# Patient Record
Sex: Female | Born: 1980 | Race: White | Hispanic: No | Marital: Married | Smoking: Never smoker
Health system: Southern US, Community
[De-identification: ages and names within clinical notes are randomized; demographics above are authoritative.]

## PROBLEM LIST (undated history)

## (undated) ENCOUNTER — Inpatient Hospital Stay (HOSPITAL_COMMUNITY): Payer: Self-pay

## (undated) DIAGNOSIS — O24419 Gestational diabetes mellitus in pregnancy, unspecified control: Secondary | ICD-10-CM

## (undated) DIAGNOSIS — O139 Gestational [pregnancy-induced] hypertension without significant proteinuria, unspecified trimester: Secondary | ICD-10-CM

## (undated) DIAGNOSIS — G510 Bell's palsy: Secondary | ICD-10-CM

## (undated) DIAGNOSIS — O1490 Unspecified pre-eclampsia, unspecified trimester: Secondary | ICD-10-CM

## (undated) HISTORY — DX: Bell's palsy: G51.0

## (undated) HISTORY — DX: Gestational diabetes mellitus in pregnancy, unspecified control: O24.419

## (undated) HISTORY — PX: OTHER SURGICAL HISTORY: SHX169

## (undated) HISTORY — PX: NO PAST SURGERIES: SHX2092

---

## 2003-11-13 ENCOUNTER — Inpatient Hospital Stay (HOSPITAL_COMMUNITY): Admission: AD | Admit: 2003-11-13 | Discharge: 2003-11-13 | Payer: Self-pay | Admitting: Obstetrics and Gynecology

## 2004-01-25 ENCOUNTER — Inpatient Hospital Stay (HOSPITAL_COMMUNITY): Admission: AD | Admit: 2004-01-25 | Discharge: 2004-01-25 | Payer: Self-pay | Admitting: Obstetrics & Gynecology

## 2004-02-12 ENCOUNTER — Inpatient Hospital Stay (HOSPITAL_COMMUNITY): Admission: AD | Admit: 2004-02-12 | Discharge: 2004-02-12 | Payer: Self-pay | Admitting: Obstetrics and Gynecology

## 2004-02-13 ENCOUNTER — Inpatient Hospital Stay (HOSPITAL_COMMUNITY): Admission: AD | Admit: 2004-02-13 | Discharge: 2004-02-14 | Payer: Self-pay | Admitting: Obstetrics and Gynecology

## 2004-02-15 ENCOUNTER — Inpatient Hospital Stay (HOSPITAL_COMMUNITY): Admission: AD | Admit: 2004-02-15 | Discharge: 2004-02-15 | Payer: Self-pay | Admitting: Obstetrics and Gynecology

## 2004-02-18 ENCOUNTER — Inpatient Hospital Stay (HOSPITAL_COMMUNITY): Admission: AD | Admit: 2004-02-18 | Discharge: 2004-02-18 | Payer: Self-pay | Admitting: Obstetrics and Gynecology

## 2004-02-19 ENCOUNTER — Inpatient Hospital Stay (HOSPITAL_COMMUNITY): Admission: AD | Admit: 2004-02-19 | Discharge: 2004-02-22 | Payer: Self-pay | Admitting: Obstetrics & Gynecology

## 2004-02-20 ENCOUNTER — Encounter (INDEPENDENT_AMBULATORY_CARE_PROVIDER_SITE_OTHER): Payer: Self-pay | Admitting: *Deleted

## 2004-04-09 ENCOUNTER — Other Ambulatory Visit: Admission: RE | Admit: 2004-04-09 | Discharge: 2004-04-09 | Payer: Self-pay | Admitting: Obstetrics and Gynecology

## 2005-10-21 ENCOUNTER — Other Ambulatory Visit: Admission: RE | Admit: 2005-10-21 | Discharge: 2005-10-21 | Payer: Self-pay | Admitting: Obstetrics and Gynecology

## 2007-11-16 ENCOUNTER — Inpatient Hospital Stay (HOSPITAL_COMMUNITY): Admission: AD | Admit: 2007-11-16 | Discharge: 2007-11-16 | Payer: Self-pay | Admitting: Obstetrics and Gynecology

## 2010-12-31 LAB — CBC
MCHC: 34.5
RBC: 4
RDW: 13.4
WBC: 7.8

## 2010-12-31 LAB — HCG, QUANTITATIVE, PREGNANCY: hCG, Beta Chain, Quant, S: 4

## 2010-12-31 LAB — ABO/RH: ABO/RH(D): B POS

## 2013-01-23 ENCOUNTER — Encounter: Payer: Self-pay | Admitting: Advanced Practice Midwife

## 2013-01-23 ENCOUNTER — Ambulatory Visit (INDEPENDENT_AMBULATORY_CARE_PROVIDER_SITE_OTHER): Payer: 59 | Admitting: Advanced Practice Midwife

## 2013-01-23 VITALS — BP 124/76 | Ht 62.0 in | Wt 217.0 lb

## 2013-01-23 DIAGNOSIS — Z8279 Family history of other congenital malformations, deformations and chromosomal abnormalities: Secondary | ICD-10-CM

## 2013-01-23 DIAGNOSIS — Z3480 Encounter for supervision of other normal pregnancy, unspecified trimester: Secondary | ICD-10-CM

## 2013-01-23 DIAGNOSIS — O09299 Supervision of pregnancy with other poor reproductive or obstetric history, unspecified trimester: Secondary | ICD-10-CM | POA: Insufficient documentation

## 2013-01-23 DIAGNOSIS — O09291 Supervision of pregnancy with other poor reproductive or obstetric history, first trimester: Secondary | ICD-10-CM

## 2013-01-23 DIAGNOSIS — O09211 Supervision of pregnancy with history of pre-term labor, first trimester: Secondary | ICD-10-CM | POA: Insufficient documentation

## 2013-01-23 DIAGNOSIS — E669 Obesity, unspecified: Secondary | ICD-10-CM

## 2013-01-23 DIAGNOSIS — O09219 Supervision of pregnancy with history of pre-term labor, unspecified trimester: Secondary | ICD-10-CM

## 2013-01-23 LAB — OB RESULTS CONSOLE GC/CHLAMYDIA
Chlamydia: NEGATIVE
Gonorrhea: NEGATIVE

## 2013-01-23 NOTE — Patient Instructions (Signed)
Pregnancy - First Trimester  During sexual intercourse, millions of sperm go into the vagina. Only 1 sperm will penetrate and fertilize the female egg while it is in the Fallopian tube. One week later, the fertilized egg implants into the wall of the uterus. An embryo begins to develop into a baby. At 6 to 8 weeks, the eyes and face are formed and the heartbeat can be seen on ultrasound. At the end of 12 weeks (first trimester), all the baby's organs are formed. Now that you are pregnant, you will want to do everything you can to have a healthy baby. Two of the most important things are to get good prenatal care and follow your caregiver's instructions. Prenatal care is all the medical care you receive before the baby's birth. It is given to prevent, find, and treat problems during the pregnancy and childbirth.  PRENATAL EXAMS  · During prenatal visits, your weight, blood pressure, and urine are checked. This is done to make sure you are healthy and progressing normally during the pregnancy.  · A pregnant woman should gain 25 to 35 pounds during the pregnancy. However, if you are overweight or underweight, your caregiver will advise you regarding your weight.  · Your caregiver will ask and answer questions for you.  · Blood work, cervical cultures, other necessary tests, and a Pap test are done during your prenatal exams. These tests are done to check on your health and the probable health of your baby. Tests are strongly recommended and done for HIV with your permission. This is the virus that causes AIDS. These tests are done because medicines can be given to help prevent your baby from being born with this infection should you have been infected without knowing it. Blood work is also used to find out your blood type, previous infections, and follow your blood levels (hemoglobin).  · Low hemoglobin (anemia) is common during pregnancy. Iron and vitamins are given to help prevent this. Later in the pregnancy, blood  tests for diabetes will be done along with any other tests if any problems develop.  · You may need other tests to make sure you and the baby are doing well.  CHANGES DURING THE FIRST TRIMESTER   Your body goes through many changes during pregnancy. They vary from person to person. Talk to your caregiver about changes you notice and are concerned about. Changes can include:  · Your menstrual period stops.  · The egg and sperm carry the genes that determine what you look like. Genes from you and your partner are forming a baby. The female genes determine whether the baby is a boy or a girl.  · Your body increases in girth and you may feel bloated.  · Feeling sick to your stomach (nauseous) and throwing up (vomiting). If the vomiting is uncontrollable, call your caregiver.  · Your breasts will begin to enlarge and become tender.  · Your nipples may stick out more and become darker.  · The need to urinate more. Painful urination may mean you have a bladder infection.  · Tiring easily.  · Loss of appetite.  · Cravings for certain kinds of food.  · At first, you may gain or lose a couple of pounds.  · You may have changes in your emotions from day to day (excited to be pregnant or concerned something may go wrong with the pregnancy and baby).  · You may have more vivid and strange dreams.  HOME CARE INSTRUCTIONS   ·   It is very important to avoid all smoking, alcohol and non-prescribed drugs during your pregnancy. These affect the formation and growth of the baby. Avoid chemicals while pregnant to ensure the delivery of a healthy infant.  · Start your prenatal visits by the 12th week of pregnancy. They are usually scheduled monthly at first, then more often in the last 2 months before delivery. Keep your caregiver's appointments. Follow your caregiver's instructions regarding medicine use, blood and lab tests, exercise, and diet.  · During pregnancy, you are providing food for you and your baby. Eat regular, well-balanced  meals. Choose foods such as meat, fish, milk and other low fat dairy products, vegetables, fruits, and whole-grain breads and cereals. Your caregiver will tell you of the ideal weight gain.  · You can help morning sickness by keeping soda crackers at the bedside. Eat a couple before arising in the morning. You may want to use the crackers without salt on them.  · Eating 4 to 5 small meals rather than 3 large meals a day also may help the nausea and vomiting.  · Drinking liquids between meals instead of during meals also seems to help nausea and vomiting.  · A physical sexual relationship may be continued throughout pregnancy if there are no other problems. Problems may be early (premature) leaking of amniotic fluid from the membranes, vaginal bleeding, or belly (abdominal) pain.  · Exercise regularly if there are no restrictions. Check with your caregiver or physical therapist if you are unsure of the safety of some of your exercises. Greater weight gain will occur in the last 2 trimesters of pregnancy. Exercising will help:  · Control your weight.  · Keep you in shape.  · Prepare you for labor and delivery.  · Help you lose your pregnancy weight after you deliver your baby.  · Wear a good support or jogging bra for breast tenderness during pregnancy. This may help if worn during sleep too.  · Ask when prenatal classes are available. Begin classes when they are offered.  · Do not use hot tubs, steam rooms, or saunas.  · Wear your seat belt when driving. This protects you and your baby if you are in an accident.  · Avoid raw meat, uncooked cheese, cat litter boxes, and soil used by cats throughout the pregnancy. These carry germs that can cause birth defects in the baby.  · The first trimester is a good time to visit your dentist for your dental health. Getting your teeth cleaned is okay. Use a softer toothbrush and brush gently during pregnancy.  · Ask for help if you have financial, counseling, or nutritional needs  during pregnancy. Your caregiver will be able to offer counseling for these needs as well as refer you for other special needs.  · Do not take any medicines or herbs unless told by your caregiver.  · Inform your caregiver if there is any mental or physical domestic violence.  · Make a list of emergency phone numbers of family, friends, hospital, and police and fire departments.  · Write down your questions. Take them to your prenatal visit.  · Do not douche.  · Do not cross your legs.  · If you have to stand for long periods of time, rotate you feet or take small steps in a circle.  · You may have more vaginal secretions that may require a sanitary pad. Do not use tampons or scented sanitary pads.  MEDICINES AND DRUG USE IN PREGNANCY  ·   Take prenatal vitamins as directed. The vitamin should contain 1 milligram of folic acid. Keep all vitamins out of reach of children. Only a couple vitamins or tablets containing iron may be fatal to a baby or young child when ingested.  · Avoid use of all medicines, including herbs, over-the-counter medicines, not prescribed or suggested by your caregiver. Only take over-the-counter or prescription medicines for pain, discomfort, or fever as directed by your caregiver. Do not use aspirin, ibuprofen, or naproxen unless directed by your caregiver.  · Let your caregiver also know about herbs you may be using.  · Alcohol is related to a number of birth defects. This includes fetal alcohol syndrome. All alcohol, in any form, should be avoided completely. Smoking will cause low birth rate and premature babies.  · Street or illegal drugs are very harmful to the baby. They are absolutely forbidden. A baby born to an addicted mother will be addicted at birth. The baby will go through the same withdrawal an adult does.  · Let your caregiver know about any medicines that you have to take and for what reason you take them.  SEEK MEDICAL CARE IF:   You have any concerns or worries during your  pregnancy. It is better to call with your questions if you feel they cannot wait, rather than worry about them.  SEEK IMMEDIATE MEDICAL CARE IF:   · An unexplained oral temperature above 102° F (38.9° C) develops, or as your caregiver suggests.  · You have leaking of fluid from the vagina (birth canal). If leaking membranes are suspected, take your temperature and inform your caregiver of this when you call.  · There is vaginal spotting or bleeding. Notify your caregiver of the amount and how many pads are used.  · You develop a bad smelling vaginal discharge with a change in the color.  · You continue to feel sick to your stomach (nauseated) and have no relief from remedies suggested. You vomit blood or coffee ground-like materials.  · You lose more than 2 pounds of weight in 1 week.  · You gain more than 2 pounds of weight in 1 week and you notice swelling of your face, hands, feet, or legs.  · You gain 5 pounds or more in 1 week (even if you do not have swelling of your hands, face, legs, or feet).  · You get exposed to German measles and have never had them.  · You are exposed to fifth disease or chickenpox.  · You develop belly (abdominal) pain. Round ligament discomfort is a common non-cancerous (benign) cause of abdominal pain in pregnancy. Your caregiver still must evaluate this.  · You develop headache, fever, diarrhea, pain with urination, or shortness of breath.  · You fall or are in a car accident or have any kind of trauma.  · There is mental or physical violence in your home.  Document Released: 03/15/2001 Document Revised: 12/14/2011 Document Reviewed: 09/16/2008  [employer]® Patient Information ©2014 [employer].

## 2013-01-23 NOTE — Progress Notes (Signed)
P - 88 - Pt here for initial OB - 4th preg - last preg was miscarriage at 7weeks.

## 2013-01-23 NOTE — Progress Notes (Signed)
Bedside U/S showed viable IUP with positive FHT 138 and CRL of 5.72mm

## 2013-01-23 NOTE — Progress Notes (Signed)
New OB visit. History reviewed. See problem list. See other note   Subjective:    Olivia Moon is a [id number] [redacted]w[redacted]d being seen today for her first obstetrical visit.  Her obstetrical history is significant for obesity and history of Preeclampsia with IOL at 33 weeks, other delivery at 37 weeks. Patient does intend to breast feed. Pregnancy history fully reviewed.  Patient reports no complaints.  Filed Vitals:   01/23/13 1359 01/23/13 1433  BP: 124/76   Height:  5\' 2"  (1.575 m)  Weight: 98.431 kg (217 lb)     HISTORY: OB History  Gravida Para Term Preterm AB SAB TAB Ectopic Multiple Living  4 2 1 1 1 1    2     # Outcome Date GA Lbr Len/2nd Weight Sex Delivery Anes PTL Lv  4 CUR           3 SAB 2009 [redacted]w[redacted]d         2 TRM 2005 [redacted]w[redacted]d    SVD   Y  1 PRE 2004 [redacted]w[redacted]d       Y     History reviewed. No pertinent past medical history. History reviewed. No pertinent past surgical history. Family History  Problem Relation Age of Onset  . Diabetes Mother   . Breast cancer Maternal Grandmother   . Hypertension Mother   . Breast cancer Maternal Aunt   . Hypertension Maternal Grandmother   . Hypertension Sister   . Diabetes Sister   . Heart disease Father   . Rheumatic fever Father   . Heart disease Paternal Grandfather      Exam    Uterus:  Fundal Height: 6 cm  Pelvic Exam:    Perineum: No Hemorrhoids, Normal Perineum   Vulva: Bartholin's, Urethra, Skene's normal   Vagina:  normal mucosa, normal discharge   pH:    Cervix: no cervical motion tenderness   Adnexa: normal adnexa   Bony Pelvis: gynecoid  System: Breast:  normal appearance, no masses or tenderness   Skin: normal coloration and turgor, no rashes    Neurologic: oriented, grossly non-focal   Extremities: normal strength, tone, and muscle mass   HEENT neck supple with midline trachea   Mouth/Teeth mucous membranes moist, pharynx normal without lesions   Neck supple and no masses   Cardiovascular: regular rate and  rhythm   Respiratory:  appears well, vitals normal, no respiratory distress, acyanotic, normal RR, ear and throat exam is normal, neck free of mass or lymphadenopathy, chest clear, no wheezing, crepitations, rhonchi, normal symmetric air entry   Abdomen: soft, non-tender; bowel sounds normal; no masses,  no organomegaly   Urinary: urethral meatus normal      Assessment:    Pregnancy: [id number] Patient Active Problem List   Diagnosis Date Noted  . Pregnancy complicated by previous preterm labor in first trimester 01/23/2013  . Hx of preeclampsia, prior pregnancy, currently pregnant 01/23/2013  . Family history of congenital heart defect 01/23/2013  . Obesity (BMI 30-39.9) 01/23/2013        Plan:     Initial labs drawn. Prenatal vitamins. Problem list reviewed and updated. Genetic Screening discussed Integrated Screen: declined.  Ultrasound discussed; fetal survey: requested.  Follow up in 4 weeks. 50% of 30 min visit spent on counseling and coordination of care.  Will do early glucola due to BMI Will do baseline HTN labs due to history No need for 17P. Will probably need fetal echo   [facility] 01/23/2013

## 2013-01-24 LAB — PRESCRIPTION MONITORING PROFILE (19 PANEL)
Amphetamine/Meth: NEGATIVE ng/mL
Barbiturate Screen, Urine: NEGATIVE ng/mL
Buprenorphine, Urine: NEGATIVE ng/mL
Cannabinoid Scrn, Ur: NEGATIVE ng/mL
Cocaine Metabolites: NEGATIVE ng/mL
Creatinine, Urine: 151.02 mg/dL (ref >20.0)
Fentanyl, Ur: NEGATIVE ng/mL
MDMA URINE: NEGATIVE ng/mL
Methaqualone: NEGATIVE ng/mL
Nitrites, Initial: NEGATIVE ug/mL
Propoxyphene: NEGATIVE ng/mL
Tramadol Scrn, Ur: NEGATIVE ng/mL
pH, Initial: 7.4 pH (ref 4.5–8.9)

## 2013-01-24 LAB — OBSTETRIC PANEL
Antibody Screen: NEGATIVE
Basophils Absolute: 0 10*3/uL (ref 0.0–0.1)
Basophils Relative: 0 % (ref 0–1)
Eosinophils Absolute: 0.1 10*3/uL (ref 0.0–0.7)
Eosinophils Relative: 2 % (ref 0–5)
HCT: 34 % — ABNORMAL LOW (ref 36.0–46.0)
Hemoglobin: 11.3 g/dL — ABNORMAL LOW (ref 12.0–15.0)
Lymphocytes Relative: 28 % (ref 12–46)
MCH: 30.2 pg (ref 26.0–34.0)
MCV: 90.9 fL (ref 78.0–100.0)
Monocytes Relative: 7 % (ref 3–12)
Neutro Abs: 4.3 10*3/uL (ref 1.7–7.7)
Platelets: 254 10*3/uL (ref 150–400)
WBC: 6.8 10*3/uL (ref 4.0–10.5)

## 2013-01-24 LAB — COMPREHENSIVE METABOLIC PANEL
BUN: 11 mg/dL (ref 6–23)
CO2: 21 mEq/L (ref 19–32)
Chloride: 105 mEq/L (ref 96–112)
Creat: 0.67 mg/dL (ref 0.50–1.10)
Glucose, Bld: 96 mg/dL (ref 70–99)
Potassium: 4 mEq/L (ref 3.5–5.3)
Sodium: 136 mEq/L (ref 135–145)
Total Protein: 7.2 g/dL (ref 6.0–8.3)

## 2013-01-24 LAB — GC/CHLAMYDIA PROBE AMP, URINE
Chlamydia, Swab/Urine, PCR: NEGATIVE
GC Probe Amp, Urine: NEGATIVE

## 2013-01-24 LAB — ALCOHOL METABOLITE (ETG), URINE: Ethyl Glucuronide (EtG): NEGATIVE ng/mL

## 2013-01-30 ENCOUNTER — Encounter: Payer: Self-pay | Admitting: Advanced Practice Midwife

## 2013-01-30 LAB — GLUCOSE TOLERANCE, 1 HOUR (50G) W/O FASTING: Glucose, 1 Hour GTT: 95 mg/dL (ref 70–140)

## 2013-01-30 LAB — CREATININE CLEARANCE, URINE, 24 HOUR
Creatinine, Urine: 92.8 mg/dL
Creatinine: 0.77 mg/dL (ref 0.50–1.10)

## 2013-01-31 ENCOUNTER — Telehealth: Payer: Self-pay | Admitting: *Deleted

## 2013-01-31 NOTE — Telephone Encounter (Signed)
Called pt to adv labs are WNL - [facility] for pt to return call if needed

## 2013-02-25 ENCOUNTER — Encounter: Payer: Self-pay | Admitting: Advanced Practice Midwife

## 2013-02-25 ENCOUNTER — Ambulatory Visit (INDEPENDENT_AMBULATORY_CARE_PROVIDER_SITE_OTHER): Payer: 59 | Admitting: Advanced Practice Midwife

## 2013-02-25 VITALS — BP 129/84 | Wt 215.0 lb

## 2013-02-25 DIAGNOSIS — O09291 Supervision of pregnancy with other poor reproductive or obstetric history, first trimester: Secondary | ICD-10-CM

## 2013-02-25 DIAGNOSIS — O09299 Supervision of pregnancy with other poor reproductive or obstetric history, unspecified trimester: Secondary | ICD-10-CM

## 2013-02-25 NOTE — Progress Notes (Signed)
p-97 

## 2013-02-26 NOTE — Patient Instructions (Signed)
Pregnancy - First Trimester  During sexual intercourse, millions of sperm go into the vagina. Only 1 sperm will penetrate and fertilize the female egg while it is in the Fallopian tube. One week later, the fertilized egg implants into the wall of the uterus. An embryo begins to develop into a baby. At 6 to 8 weeks, the eyes and face are formed and the heartbeat can be seen on ultrasound. At the end of 12 weeks (first trimester), all the baby's organs are formed. Now that you are pregnant, you will want to do everything you can to have a healthy baby. Two of the most important things are to get good prenatal care and follow your caregiver's instructions. Prenatal care is all the medical care you receive before the baby's birth. It is given to prevent, find, and treat problems during the pregnancy and childbirth.  PRENATAL EXAMS  · During prenatal visits, your weight, blood pressure, and urine are checked. This is done to make sure you are healthy and progressing normally during the pregnancy.  · A pregnant woman should gain 25 to 35 pounds during the pregnancy. However, if you are overweight or underweight, your caregiver will advise you regarding your weight.  · Your caregiver will ask and answer questions for you.  · Blood work, cervical cultures, other necessary tests, and a Pap test are done during your prenatal exams. These tests are done to check on your health and the probable health of your baby. Tests are strongly recommended and done for HIV with your permission. This is the virus that causes AIDS. These tests are done because medicines can be given to help prevent your baby from being born with this infection should you have been infected without knowing it. Blood work is also used to find out your blood type, previous infections, and follow your blood levels (hemoglobin).  · Low hemoglobin (anemia) is common during pregnancy. Iron and vitamins are given to help prevent this. Later in the pregnancy, blood  tests for diabetes will be done along with any other tests if any problems develop.  · You may need other tests to make sure you and the baby are doing well.  CHANGES DURING THE FIRST TRIMESTER   Your body goes through many changes during pregnancy. They vary from person to person. Talk to your caregiver about changes you notice and are concerned about. Changes can include:  · Your menstrual period stops.  · The egg and sperm carry the genes that determine what you look like. Genes from you and your partner are forming a baby. The female genes determine whether the baby is a boy or a girl.  · Your body increases in girth and you may feel bloated.  · Feeling sick to your stomach (nauseous) and throwing up (vomiting). If the vomiting is uncontrollable, call your caregiver.  · Your breasts will begin to enlarge and become tender.  · Your nipples may stick out more and become darker.  · The need to urinate more. Painful urination may mean you have a bladder infection.  · Tiring easily.  · Loss of appetite.  · Cravings for certain kinds of food.  · At first, you may gain or lose a couple of pounds.  · You may have changes in your emotions from day to day (excited to be pregnant or concerned something may go wrong with the pregnancy and baby).  · You may have more vivid and strange dreams.  HOME CARE INSTRUCTIONS   ·   It is very important to avoid all smoking, alcohol and non-prescribed drugs during your pregnancy. These affect the formation and growth of the baby. Avoid chemicals while pregnant to ensure the delivery of a healthy infant.  · Start your prenatal visits by the 12th week of pregnancy. They are usually scheduled monthly at first, then more often in the last 2 months before delivery. Keep your caregiver's appointments. Follow your caregiver's instructions regarding medicine use, blood and lab tests, exercise, and diet.  · During pregnancy, you are providing food for you and your baby. Eat regular, well-balanced  meals. Choose foods such as meat, fish, milk and other low fat dairy products, vegetables, fruits, and whole-grain breads and cereals. Your caregiver will tell you of the ideal weight gain.  · You can help morning sickness by keeping soda crackers at the bedside. Eat a couple before arising in the morning. You may want to use the crackers without salt on them.  · Eating 4 to 5 small meals rather than 3 large meals a day also may help the nausea and vomiting.  · Drinking liquids between meals instead of during meals also seems to help nausea and vomiting.  · A physical sexual relationship may be continued throughout pregnancy if there are no other problems. Problems may be early (premature) leaking of amniotic fluid from the membranes, vaginal bleeding, or belly (abdominal) pain.  · Exercise regularly if there are no restrictions. Check with your caregiver or physical therapist if you are unsure of the safety of some of your exercises. Greater weight gain will occur in the last 2 trimesters of pregnancy. Exercising will help:  · Control your weight.  · Keep you in shape.  · Prepare you for labor and delivery.  · Help you lose your pregnancy weight after you deliver your baby.  · Wear a good support or jogging bra for breast tenderness during pregnancy. This may help if worn during sleep too.  · Ask when prenatal classes are available. Begin classes when they are offered.  · Do not use hot tubs, steam rooms, or saunas.  · Wear your seat belt when driving. This protects you and your baby if you are in an accident.  · Avoid raw meat, uncooked cheese, cat litter boxes, and soil used by cats throughout the pregnancy. These carry germs that can cause birth defects in the baby.  · The first trimester is a good time to visit your dentist for your dental health. Getting your teeth cleaned is okay. Use a softer toothbrush and brush gently during pregnancy.  · Ask for help if you have financial, counseling, or nutritional needs  during pregnancy. Your caregiver will be able to offer counseling for these needs as well as refer you for other special needs.  · Do not take any medicines or herbs unless told by your caregiver.  · Inform your caregiver if there is any mental or physical domestic violence.  · Make a list of emergency phone numbers of family, friends, hospital, and police and fire departments.  · Write down your questions. Take them to your prenatal visit.  · Do not douche.  · Do not cross your legs.  · If you have to stand for long periods of time, rotate you feet or take small steps in a circle.  · You may have more vaginal secretions that may require a sanitary pad. Do not use tampons or scented sanitary pads.  MEDICINES AND DRUG USE IN PREGNANCY  ·   Take prenatal vitamins as directed. The vitamin should contain 1 milligram of folic acid. Keep all vitamins out of reach of children. Only a couple vitamins or tablets containing iron may be fatal to a baby or young child when ingested.  · Avoid use of all medicines, including herbs, over-the-counter medicines, not prescribed or suggested by your caregiver. Only take over-the-counter or prescription medicines for pain, discomfort, or fever as directed by your caregiver. Do not use aspirin, ibuprofen, or naproxen unless directed by your caregiver.  · Let your caregiver also know about herbs you may be using.  · Alcohol is related to a number of birth defects. This includes fetal alcohol syndrome. All alcohol, in any form, should be avoided completely. Smoking will cause low birth rate and premature babies.  · Street or illegal drugs are very harmful to the baby. They are absolutely forbidden. A baby born to an addicted mother will be addicted at birth. The baby will go through the same withdrawal an adult does.  · Let your caregiver know about any medicines that you have to take and for what reason you take them.  SEEK MEDICAL CARE IF:   You have any concerns or worries during your  pregnancy. It is better to call with your questions if you feel they cannot wait, rather than worry about them.  SEEK IMMEDIATE MEDICAL CARE IF:   · An unexplained oral temperature above 102° F (38.9° C) develops, or as your caregiver suggests.  · You have leaking of fluid from the vagina (birth canal). If leaking membranes are suspected, take your temperature and inform your caregiver of this when you call.  · There is vaginal spotting or bleeding. Notify your caregiver of the amount and how many pads are used.  · You develop a bad smelling vaginal discharge with a change in the color.  · You continue to feel sick to your stomach (nauseated) and have no relief from remedies suggested. You vomit blood or coffee ground-like materials.  · You lose more than 2 pounds of weight in 1 week.  · You gain more than 2 pounds of weight in 1 week and you notice swelling of your face, hands, feet, or legs.  · You gain 5 pounds or more in 1 week (even if you do not have swelling of your hands, face, legs, or feet).  · You get exposed to German measles and have never had them.  · You are exposed to fifth disease or chickenpox.  · You develop belly (abdominal) pain. Round ligament discomfort is a common non-cancerous (benign) cause of abdominal pain in pregnancy. Your caregiver still must evaluate this.  · You develop headache, fever, diarrhea, pain with urination, or shortness of breath.  · You fall or are in a car accident or have any kind of trauma.  · There is mental or physical violence in your home.  Document Released: 03/15/2001 Document Revised: 12/14/2011 Document Reviewed: 09/16/2008  [employer]® Patient Information ©2014 [employer].

## 2013-02-26 NOTE — Progress Notes (Signed)
24 hr Urine protein = 45. Happy to hear FHTs today. Glucola = 95.  Feels well. Lab results reviewed.

## 2013-03-13 ENCOUNTER — Other Ambulatory Visit (INDEPENDENT_AMBULATORY_CARE_PROVIDER_SITE_OTHER): Payer: 59

## 2013-03-13 VITALS — BP 122/70 | HR 88 | Temp 97.1°F | Resp 16 | Wt 216.0 lb

## 2013-03-13 DIAGNOSIS — R1032 Left lower quadrant pain: Secondary | ICD-10-CM

## 2013-03-13 DIAGNOSIS — N390 Urinary tract infection, site not specified: Secondary | ICD-10-CM

## 2013-03-13 DIAGNOSIS — Z3201 Encounter for pregnancy test, result positive: Secondary | ICD-10-CM

## 2013-03-13 LAB — POCT URINALYSIS DIPSTICK
Glucose, UA: NEGATIVE
Ketones, UA: NEGATIVE
Spec Grav, UA: 1.01
Urobilinogen, UA: NEGATIVE

## 2013-03-13 NOTE — Progress Notes (Signed)
Pt presents today for Left pelvic groin pain.  Pt is tender only when pressure applied but not rebound.  She states that she is having regular BM's and the last one last night.  The pain is only on the left side and not constant but more of a dulling throb @ its worse a 7.  U/S today showed viable 14 w fetus with FHT 87f 160 bpm No free fluid noted.  Ovaries clear.  Urinalysis was positive for mod blood but no nitrites or leukecytes.  Pt has no history of kidney stones but has been drinking about a gallon of milk every 2 days.  Encouraged increasing fluids with water.  Pt will go to [facility]U if increasing pain, fever 101 or >, any syncope episodes.  Urine strainer and hat collection given to patient to monitor urine.  Information of s&s about kidney stones given to patient.  Pt is aware that this may not be a kidney stone but does have similar characteristics.  She will other wise follow up @ next PNV

## 2013-03-13 NOTE — Patient Instructions (Signed)
Pt given urine strainer and hat to strain urine.  Pt to go straight to MAU if increase in pain, fever >101or having difficulty voiding.  Urine culture sent.

## 2013-03-15 ENCOUNTER — Ambulatory Visit (INDEPENDENT_AMBULATORY_CARE_PROVIDER_SITE_OTHER): Payer: 59

## 2013-03-15 ENCOUNTER — Other Ambulatory Visit: Payer: Self-pay | Admitting: Family

## 2013-03-15 ENCOUNTER — Ambulatory Visit (INDEPENDENT_AMBULATORY_CARE_PROVIDER_SITE_OTHER): Payer: 59 | Admitting: Family

## 2013-03-15 ENCOUNTER — Other Ambulatory Visit: Payer: Self-pay | Admitting: Advanced Practice Midwife

## 2013-03-15 VITALS — BP 112/78 | Temp 97.0°F | Wt 213.0 lb

## 2013-03-15 DIAGNOSIS — R319 Hematuria, unspecified: Secondary | ICD-10-CM

## 2013-03-15 DIAGNOSIS — N83209 Unspecified ovarian cyst, unspecified side: Secondary | ICD-10-CM

## 2013-03-15 DIAGNOSIS — Z3480 Encounter for supervision of other normal pregnancy, unspecified trimester: Secondary | ICD-10-CM

## 2013-03-15 DIAGNOSIS — O99891 Other specified diseases and conditions complicating pregnancy: Secondary | ICD-10-CM

## 2013-03-15 DIAGNOSIS — N200 Calculus of kidney: Secondary | ICD-10-CM

## 2013-03-15 LAB — POCT URINALYSIS DIPSTICK
Ketones, UA: NEGATIVE
Nitrite, UA: NEGATIVE
Urobilinogen, UA: 0.2

## 2013-03-15 MED ORDER — OXYCODONE-ACETAMINOPHEN 5-325 MG PO TABS: 1.0000 | ORAL_TABLET | Freq: Four times a day (QID) | ORAL | Status: DC | PRN

## 2013-03-15 NOTE — Progress Notes (Signed)
P - 90 - Per [facility] - Urine Culture is still pending

## 2013-03-15 NOTE — Progress Notes (Signed)
Reports left flank pain that started two days ago that radiates to lower left pelvis.  Pain is described as "cramping".  No report of UTI symptoms, fever, body aches or chills.  Nausea and vomitng of early pregnancy resolving.  UA mod blood, otherwise normal. Schedule transvaginal and renal ultrasound.  L. Leftwich-Kirby notified regarding pt going for ultrasound and that results will be called to her for plan of care.

## 2013-03-15 NOTE — Progress Notes (Signed)
U/S at [facility] called to report ultrasound findings today, with 3.9 mm and 7.9 mm stone visible in left kidney.  Consulted Dr Marice Potter.  Percocet 5/325, take 1-2 tabs Q 6 hours PRN pain x15 tabs.  Drink plenty of PO fluids.  Called pt to inform her of prescription and recommendation to increase fluids.  Pt is to f/u as scheduled December 22 for next prenatal visit and to call office or come to [facility]U if pain persists or worsens.  Pt states understanding.

## 2013-03-16 LAB — CULTURE, URINE COMPREHENSIVE: Colony Count: 90000

## 2013-03-25 ENCOUNTER — Encounter: Payer: 59 | Admitting: Advanced Practice Midwife

## 2013-03-26 ENCOUNTER — Other Ambulatory Visit: Payer: Self-pay | Admitting: Family Medicine

## 2013-03-26 ENCOUNTER — Encounter: Payer: Self-pay | Admitting: Family Medicine

## 2013-03-26 ENCOUNTER — Ambulatory Visit (INDEPENDENT_AMBULATORY_CARE_PROVIDER_SITE_OTHER): Payer: 59 | Admitting: Family Medicine

## 2013-03-26 VITALS — BP 124/78 | Wt 214.0 lb

## 2013-03-26 DIAGNOSIS — O09299 Supervision of pregnancy with other poor reproductive or obstetric history, unspecified trimester: Secondary | ICD-10-CM

## 2013-03-26 DIAGNOSIS — O09292 Supervision of pregnancy with other poor reproductive or obstetric history, second trimester: Secondary | ICD-10-CM

## 2013-03-26 MED ORDER — ASPIRIN 81 MG PO CHEW: 81.0000 mg | CHEWABLE_TABLET | Freq: Every day | ORAL | Status: DC

## 2013-03-26 NOTE — Progress Notes (Signed)
Passed 3 stones. Now feeling better. Schedule anatomy, fetal ECHO Still with small hematuria--probably ok. Re-discussed genetic screening and pt. Accepts Quad.

## 2013-03-26 NOTE — Progress Notes (Signed)
p-88  Urine-blood-small

## 2013-03-26 NOTE — Patient Instructions (Signed)
Second Trimester of Pregnancy The second trimester is from week 13 through week 28, months 4 through 6. The second trimester is often a time when you feel your best. Your body has also adjusted to being pregnant, and you begin to feel better physically. Usually, morning sickness has lessened or quit completely, you may have more energy, and you may have an increase in appetite. The second trimester is also a time when the fetus is growing rapidly. At the end of the sixth month, the fetus is about 9 inches long and weighs about 1 pounds. You will likely begin to feel the baby move (quickening) between 18 and 20 weeks of the pregnancy. BODY CHANGES Your body goes through many changes during pregnancy. The changes vary from woman to woman.   Your weight will continue to increase. You will notice your lower abdomen bulging out.  You may begin to get stretch marks on your hips, abdomen, and breasts.  You may develop headaches that can be relieved by medicines approved by your caregiver.  You may urinate more often because the fetus is pressing on your bladder.  You may develop or continue to have heartburn as a result of your pregnancy.  You may develop constipation because certain hormones are causing the muscles that push waste through your intestines to slow down.  You may develop hemorrhoids or swollen, bulging veins (varicose veins).  You may have back pain because of the weight gain and pregnancy hormones relaxing your joints between the bones in your pelvis and as a result of a shift in weight and the muscles that support your balance.  Your breasts will continue to grow and be tender.  Your gums may bleed and may be sensitive to brushing and flossing.  Dark spots or blotches (chloasma, mask of pregnancy) may develop on your face. This will likely fade after the baby is born.  A dark line from your belly button to the pubic area (linea nigra) may appear. This will likely fade after  the baby is born. WHAT TO EXPECT AT YOUR PRENATAL VISITS During a routine prenatal visit:  You will be weighed to make sure you and the fetus are growing normally.  Your blood pressure will be taken.  Your abdomen will be measured to track your baby's growth.  The fetal heartbeat will be listened to.  Any test results from the previous visit will be discussed. Your caregiver may ask you:  How you are feeling.  If you are feeling the baby move.  If you have had any abnormal symptoms, such as leaking fluid, bleeding, severe headaches, or abdominal cramping.  If you have any questions. Other tests that may be performed during your second trimester include:  Blood tests that check for:  Low iron levels (anemia).  Gestational diabetes (between 24 and 28 weeks).  Rh antibodies.  Urine tests to check for infections, diabetes, or protein in the urine.  An ultrasound to confirm the proper growth and development of the baby.  An amniocentesis to check for possible genetic problems.  Fetal screens for spina bifida and Down syndrome. HOME CARE INSTRUCTIONS   Avoid all smoking, herbs, alcohol, and unprescribed drugs. These chemicals affect the formation and growth of the baby.  Follow your caregiver's instructions regarding medicine use. There are medicines that are either safe or unsafe to take during pregnancy.  Exercise only as directed by your caregiver. Experiencing uterine cramps is a good sign to stop exercising.  Continue to eat regular,   healthy meals.  Wear a good support bra for breast tenderness.  Do not use hot tubs, steam rooms, or saunas.  Wear your seat belt at all times when driving.  Avoid raw meat, uncooked cheese, cat litter boxes, and soil used by cats. These carry germs that can cause birth defects in the baby.  Take your prenatal vitamins.  Try taking a stool softener (if your caregiver approves) if you develop constipation. Eat more high-fiber  foods, such as fresh vegetables or fruit and whole grains. Drink plenty of fluids to keep your urine clear or pale yellow.  Take warm sitz baths to soothe any pain or discomfort caused by hemorrhoids. Use hemorrhoid cream if your caregiver approves.  If you develop varicose veins, wear support hose. Elevate your feet for 15 minutes, 3 4 times a day. Limit salt in your diet.  Avoid heavy lifting, wear low heel shoes, and practice good posture.  Rest with your legs elevated if you have leg cramps or low back pain.  Visit your dentist if you have not gone yet during your pregnancy. Use a soft toothbrush to brush your teeth and be gentle when you floss.  A sexual relationship may be continued unless your caregiver directs you otherwise.  Continue to go to all your prenatal visits as directed by your caregiver. SEEK MEDICAL CARE IF:   You have dizziness.  You have mild pelvic cramps, pelvic pressure, or nagging pain in the abdominal area.  You have persistent nausea, vomiting, or diarrhea.  You have a bad smelling vaginal discharge.  You have pain with urination. SEEK IMMEDIATE MEDICAL CARE IF:   You have a fever.  You are leaking fluid from your vagina.  You have spotting or bleeding from your vagina.  You have severe abdominal cramping or pain.  You have rapid weight gain or loss.  You have shortness of breath with chest pain.  You notice sudden or extreme swelling of your face, hands, ankles, feet, or legs.  You have not felt your baby move in over an hour.  You have severe headaches that do not go away with medicine.  You have vision changes. Document Released: 03/15/2001 Document Revised: 11/21/2012 Document Reviewed: 05/22/2012 [employer] Patient Information 2014 [employer].  Breastfeeding Deciding to breastfeed is one of the best choices you can make for you and your baby. A change in hormones during pregnancy causes your breast tissue to grow and increases the  number and size of your milk ducts. These hormones also allow proteins, sugars, and fats from your blood supply to make breast milk in your milk-producing glands. Hormones prevent breast milk from being released before your baby is born as well as prompt milk flow after birth. Once breastfeeding has begun, thoughts of your baby, as well as his or her sucking or crying, can stimulate the release of milk from your milk-producing glands.  BENEFITS OF BREASTFEEDING For Your Baby  Your first milk (colostrum) helps your baby's digestive system function better.   There are antibodies in your milk that help your baby fight off infections.   Your baby has a lower incidence of asthma, allergies, and sudden infant death syndrome.   The nutrients in breast milk are better for your baby than infant formulas and are designed uniquely for your baby's needs.   Breast milk improves your baby's brain development.   Your baby is less likely to develop other conditions, such as childhood obesity, asthma, or type 2 diabetes mellitus.  For   You   Breastfeeding helps to create a very special bond between you and your baby.   Breastfeeding is convenient. Breast milk is always available at the correct temperature and costs nothing.   Breastfeeding helps to burn calories and helps you lose the weight gained during pregnancy.   Breastfeeding makes your uterus contract to its prepregnancy size faster and slows bleeding (lochia) after you give birth.   Breastfeeding helps to lower your risk of developing type 2 diabetes mellitus, osteoporosis, and breast or ovarian cancer later in life. SIGNS THAT YOUR BABY IS HUNGRY Early Signs of Hunger  Increased alertness or activity.  Stretching.  Movement of the head from side to side.  Movement of the head and opening of the mouth when the corner of the mouth or cheek is stroked (rooting).  Increased sucking sounds, smacking lips, cooing, sighing, or  squeaking.  Hand-to-mouth movements.  Increased sucking of fingers or hands. Late Signs of Hunger  Fussing.  Intermittent crying. Extreme Signs of Hunger Signs of extreme hunger will require calming and consoling before your baby will be able to breastfeed successfully. Do not wait for the following signs of extreme hunger to occur before you initiate breastfeeding:   Restlessness.  A loud, strong cry.   Screaming. BREASTFEEDING BASICS Breastfeeding Initiation  Find a comfortable place to sit or lie down, with your neck and back well supported.  Place a pillow or rolled up blanket under your baby to bring him or her to the level of your breast (if you are seated). Nursing pillows are specially designed to help support your arms and your baby while you breastfeed.  Make sure that your baby's abdomen is facing your abdomen.   Gently massage your breast. With your fingertips, massage from your chest wall toward your nipple in a circular motion. This encourages milk flow. You may need to continue this action during the feeding if your milk flows slowly.  Support your breast with 4 fingers underneath and your thumb above your nipple. Make sure your fingers are well away from your nipple and your baby's mouth.   Stroke your baby's lips gently with your finger or nipple.   When your baby's mouth is open wide enough, quickly bring your baby to your breast, placing your entire nipple and as much of the colored area around your nipple (areola) as possible into your baby's mouth.   More areola should be visible above your baby's upper lip than below the lower lip.   Your baby's tongue should be between his or her lower gum and your breast.   Ensure that your baby's mouth is correctly positioned around your nipple (latched). Your baby's lips should create a seal on your breast and be turned out (everted).  It is common for your baby to suck about 2 3 minutes in order to start the  flow of breast milk. Latching Teaching your baby how to latch on to your breast properly is very important. An improper latch can cause nipple pain and decreased milk supply for you and poor weight gain in your baby. Also, if your baby is not latched onto your nipple properly, he or she may swallow some air during feeding. This can make your baby fussy. Burping your baby when you switch breasts during the feeding can help to get rid of the air. However, teaching your baby to latch on properly is still the best way to prevent fussiness from swallowing air while breastfeeding. Signs that your baby has   successfully latched on to your nipple:    Silent tugging or silent sucking, without causing you pain.   Swallowing heard between every 3 4 sucks.    Muscle movement above and in front of his or her ears while sucking.  Signs that your baby has not successfully latched on to nipple:   Sucking sounds or smacking sounds from your baby while breastfeeding.  Nipple pain. If you think your baby has not latched on correctly, slip your finger into the corner of your baby's mouth to break the suction and place it between your baby's gums. Attempt breastfeeding initiation again. Signs of Successful Breastfeeding Signs from your baby:   A gradual decrease in the number of sucks or complete cessation of sucking.   Falling asleep.   Relaxation of his or her body.   Retention of a small amount of milk in his or her mouth.   Letting go of your breast by himself or herself. Signs from you:  Breasts that have increased in firmness, weight, and size 1 3 hours after feeding.   Breasts that are softer immediately after breastfeeding.  Increased milk volume, as well as a change in milk consistency and color by the 5th day of breastfeeding.   Nipples that are not sore, cracked, or bleeding. Signs That Your Baby is Getting Enough Milk  Wetting at least 3 diapers in a 24-hour period. The urine  should be clear and pale yellow by age 5 days.  At least 3 stools in a 24-hour period by age 5 days. The stool should be soft and yellow.  At least 3 stools in a 24-hour period by age 7 days. The stool should be seedy and yellow.  No loss of weight greater than 10% of birth weight during the first 3 days of age.  Average weight gain of 4 7 ounces (120 210 mL) per week after age 4 days.  Consistent daily weight gain by age 5 days, without weight loss after the age of 2 weeks. After a feeding, your baby may spit up a small amount. This is common. BREASTFEEDING FREQUENCY AND DURATION Frequent feeding will help you make more milk and can prevent sore nipples and breast engorgement. Breastfeed when you feel the need to reduce the fullness of your breasts or when your baby shows signs of hunger. This is called "breastfeeding on demand." Avoid introducing a pacifier to your baby while you are working to establish breastfeeding (the first 4 6 weeks after your baby is born). After this time you may choose to use a pacifier. Research has shown that pacifier use during the first year of a baby's life decreases the risk of sudden infant death syndrome (SIDS). Allow your baby to feed on each breast as long as he or she wants. Breastfeed until your baby is finished feeding. When your baby unlatches or falls asleep while feeding from the first breast, offer the second breast. Because newborns are often sleepy in the first few weeks of life, you may need to awaken your baby to get him or her to feed. Breastfeeding times will vary from baby to baby. However, the following rules can serve as a guide to help you ensure that your baby is properly fed:  Newborns (babies 4 weeks of age or younger) may breastfeed every 1 3 hours.  Newborns should not go longer than 3 hours during the day or 5 hours during the night without breastfeeding.  You should breastfeed your baby a minimum of   8 times in a 24-hour period until  you begin to introduce solid foods to your baby at around 6 months of age. BREAST MILK PUMPING Pumping and storing breast milk allows you to ensure that your baby is exclusively fed your breast milk, even at times when you are unable to breastfeed. This is especially important if you are going back to work while you are still breastfeeding or when you are not able to be present during feedings. Your lactation consultant can give you guidelines on how long it is safe to store breast milk.  A breast pump is a machine that allows you to pump milk from your breast into a sterile bottle. The pumped breast milk can then be stored in a refrigerator or freezer. Some breast pumps are operated by hand, while others use electricity. Ask your lactation consultant which type will work best for you. Breast pumps can be purchased, but some hospitals and breastfeeding support groups lease breast pumps on a monthly basis. A lactation consultant can teach you how to hand express breast milk, if you prefer not to use a pump.  CARING FOR YOUR BREASTS WHILE YOU BREASTFEED Nipples can become dry, cracked, and sore while breastfeeding. The following recommendations can help keep your breasts moisturized and healthy:  Avoid using soap on your nipples.   Wear a supportive bra. Although not required, special nursing bras and tank tops are designed to allow access to your breasts for breastfeeding without taking off your entire bra or top. Avoid wearing underwire style bras or extremely tight bras.  Air dry your nipples for 3 4minutes after each feeding.   Use only cotton bra pads to absorb leaked breast milk. Leaking of breast milk between feedings is normal.   Use lanolin on your nipples after breastfeeding. Lanolin helps to maintain your skin's normal moisture barrier. If you use pure lanolin you do not need to wash it off before feeding your baby again. Pure lanolin is not toxic to your baby. You may also hand express a  few drops of breast milk and gently massage that milk into your nipples and allow the milk to air dry. In the first few weeks after giving birth, some women experience extremely full breasts (engorgement). Engorgement can make your breasts feel heavy, warm, and tender to the touch. Engorgement peaks within 3 5 days after you give birth. The following recommendations can help ease engorgement:  Completely empty your breasts while breastfeeding or pumping. You may want to start by applying warm, moist heat (in the shower or with warm water-soaked hand towels) just before feeding or pumping. This increases circulation and helps the milk flow. If your baby does not completely empty your breasts while breastfeeding, pump any extra milk after he or she is finished.  Wear a snug bra (nursing or regular) or tank top for 1 2 days to signal your body to slightly decrease milk production.  Apply ice packs to your breasts, unless this is too uncomfortable for you.  Make sure that your baby is latched on and positioned properly while breastfeeding. If engorgement persists after 48 hours of following these recommendations, contact your health care provider or a lactation consultant. OVERALL HEALTH CARE RECOMMENDATIONS WHILE BREASTFEEDING  Eat healthy foods. Alternate between meals and snacks, eating 3 of each per day. Because what you eat affects your breast milk, some of the foods may make your baby more irritable than usual. Avoid eating these foods if you are sure that they are   negatively affecting your baby.  Drink milk, fruit juice, and water to satisfy your thirst (about 10 glasses a day).   Rest often, relax, and continue to take your prenatal vitamins to prevent fatigue, stress, and anemia.  Continue breast self-awareness checks.  Avoid chewing and smoking tobacco.  Avoid alcohol and drug use. Some medicines that may be harmful to your baby can pass through breast milk. It is important to ask your  health care provider before taking any medicine, including all over-the-counter and prescription medicine as well as vitamin and herbal supplements. It is possible to become pregnant while breastfeeding. If birth control is desired, ask your health care provider about options that will be safe for your baby. SEEK MEDICAL CARE IF:   You feel like you want to stop breastfeeding or have become frustrated with breastfeeding.  You have painful breasts or nipples.  Your nipples are cracked or bleeding.  Your breasts are red, tender, or warm.  You have a swollen area on either breast.  You have a fever or chills.  You have nausea or vomiting.  You have drainage other than breast milk from your nipples.  Your breasts do not become full before feedings by the 5th day after you give birth.  You feel sad and depressed.  Your baby is too sleepy to eat well.  Your baby is having trouble sleeping.   Your baby is wetting less than 3 diapers in a 24-hour period.  Your baby has less than 3 stools in a 24-hour period.  Your baby's skin or the white part of his or her eyes becomes yellow.   Your baby is not gaining weight by 5 days of age. SEEK IMMEDIATE MEDICAL CARE IF:   Your baby is overly tired (lethargic) and does not want to wake up and feed.  Your baby develops an unexplained fever. Document Released: 03/21/2005 Document Revised: 11/21/2012 Document Reviewed: 09/12/2012 [employer] Patient Information 2014 [employer].  

## 2013-03-27 LAB — ALPHA FETOPROTEIN, MATERNAL
Curr Gest Age: 15.1 wks.days
MoM for AFP: 1.3
Open Spina bifida: NEGATIVE
Osb Risk: 1:5020 {titer}

## 2013-04-01 NOTE — Progress Notes (Signed)
Full AFP maternal Quad screen added per Dr Shawnie Pons.

## 2013-04-02 LAB — AFP, QUAD SCREEN
AFP: 27.3 IU/mL
Down Syndrome Scr Risk Est: 1:470 {titer}
INH: 272 pg/mL
MoM for AFP: 1.28
MoM for INH: 1.87
MoM for hCG: 3.08
Open Spina bifida: NEGATIVE
Osb Risk: 1:5320 {titer}
Trisomy 18 (Edward) Syndrome Interp.: <1:41900 {titer}

## 2013-04-03 ENCOUNTER — Telehealth: Payer: Self-pay | Admitting: *Deleted

## 2013-04-03 ENCOUNTER — Encounter: Payer: Self-pay | Admitting: Family Medicine

## 2013-04-03 NOTE — Telephone Encounter (Signed)
LM on voicemail that her Quad screen was neg.

## 2013-04-03 NOTE — Telephone Encounter (Signed)
Message copied by Granville Lewis on Wed Apr 03, 2013 10:55 AM ------      Message from: Reva Bores      Created: Wed Apr 03, 2013 10:35 AM       Her quad was normal--she probably wants this result. ------

## 2013-04-17 ENCOUNTER — Ambulatory Visit (HOSPITAL_COMMUNITY)
Admission: RE | Admit: 2013-04-17 | Discharge: 2013-04-17 | Disposition: A | Discharge status: Home / Self Care | Payer: 59 | Source: Ambulatory Visit | Attending: Family Medicine | Admitting: Family Medicine

## 2013-04-17 VITALS — BP 128/73 | HR 89 | Wt 218.5 lb

## 2013-04-17 DIAGNOSIS — O352XX0 Maternal care for (suspected) hereditary disease in fetus, not applicable or unspecified: Secondary | ICD-10-CM | POA: Insufficient documentation

## 2013-04-17 DIAGNOSIS — O09299 Supervision of pregnancy with other poor reproductive or obstetric history, unspecified trimester: Secondary | ICD-10-CM

## 2013-04-17 DIAGNOSIS — Z8751 Personal history of pre-term labor: Secondary | ICD-10-CM | POA: Insufficient documentation

## 2013-04-17 DIAGNOSIS — O09292 Supervision of pregnancy with other poor reproductive or obstetric history, second trimester: Secondary | ICD-10-CM

## 2013-04-17 DIAGNOSIS — Z363 Encounter for antenatal screening for malformations: Secondary | ICD-10-CM | POA: Insufficient documentation

## 2013-04-17 DIAGNOSIS — O358XX0 Maternal care for other (suspected) fetal abnormality and damage, not applicable or unspecified: Secondary | ICD-10-CM | POA: Insufficient documentation

## 2013-04-17 DIAGNOSIS — Z1389 Encounter for screening for other disorder: Secondary | ICD-10-CM | POA: Insufficient documentation

## 2013-04-17 NOTE — Progress Notes (Signed)
Olivia Moon  was seen today for an ultrasound appointment.  See full report in [facility].  Impression: Single IUP at 18 2/7 weeks Hx of preeclampsia, family history of congenital heart defects - had normal fetal echo Normal fetal anatomic survey No markers associated with aneuploidy noted Posterior placenta without previa Normal amniotic fluid volume  Recommendations: Recommend follow-up ultrasound examination in 6 weeks for growth due to hx of preeclampsia  Benjaman Lobe, MD

## 2013-04-18 ENCOUNTER — Encounter: Payer: Self-pay | Admitting: Family Medicine

## 2013-04-23 ENCOUNTER — Encounter: Payer: Self-pay | Admitting: Obstetrics & Gynecology

## 2013-04-23 ENCOUNTER — Ambulatory Visit (INDEPENDENT_AMBULATORY_CARE_PROVIDER_SITE_OTHER): Payer: 59 | Admitting: Obstetrics & Gynecology

## 2013-04-23 ENCOUNTER — Encounter: Payer: Self-pay | Admitting: *Deleted

## 2013-04-23 VITALS — BP 119/73 | Wt 220.0 lb

## 2013-04-23 DIAGNOSIS — O09299 Supervision of pregnancy with other poor reproductive or obstetric history, unspecified trimester: Secondary | ICD-10-CM

## 2013-04-23 DIAGNOSIS — Z23 Encounter for immunization: Secondary | ICD-10-CM

## 2013-04-23 NOTE — Progress Notes (Signed)
No complaints.  Flu shot today.  Anatomy nml.  Patient accidentally found out she is having a boy (echo tech).  Need fetal echo results for chart.

## 2013-04-23 NOTE — Addendum Note (Signed)
Addended by: Sherryle Lis L on: 04/23/2013 02:00 PM   Modules accepted: Orders

## 2013-04-23 NOTE — Progress Notes (Signed)
p-[phone]

## 2013-04-25 ENCOUNTER — Encounter: Payer: Self-pay | Admitting: Obstetrics & Gynecology

## 2013-05-29 ENCOUNTER — Ambulatory Visit (HOSPITAL_COMMUNITY): Payer: 59

## 2013-06-03 ENCOUNTER — Ambulatory Visit (INDEPENDENT_AMBULATORY_CARE_PROVIDER_SITE_OTHER): Payer: 59 | Admitting: Advanced Practice Midwife

## 2013-06-03 ENCOUNTER — Encounter (HOSPITAL_COMMUNITY): Payer: Self-pay

## 2013-06-03 ENCOUNTER — Ambulatory Visit (HOSPITAL_COMMUNITY)
Admission: RE | Admit: 2013-06-03 | Discharge: 2013-06-03 | Disposition: A | Discharge status: Home / Self Care | Payer: 59 | Source: Ambulatory Visit | Attending: Family Medicine | Admitting: Family Medicine

## 2013-06-03 VITALS — BP 129/68 | Wt 230.0 lb

## 2013-06-03 VITALS — BP 117/80 | HR 100 | Wt 230.0 lb

## 2013-06-03 DIAGNOSIS — Z8279 Family history of other congenital malformations, deformations and chromosomal abnormalities: Secondary | ICD-10-CM

## 2013-06-03 DIAGNOSIS — O0990 Supervision of high risk pregnancy, unspecified, unspecified trimester: Secondary | ICD-10-CM | POA: Insufficient documentation

## 2013-06-03 DIAGNOSIS — O352XX0 Maternal care for (suspected) hereditary disease in fetus, not applicable or unspecified: Secondary | ICD-10-CM | POA: Insufficient documentation

## 2013-06-03 DIAGNOSIS — Z3480 Encounter for supervision of other normal pregnancy, unspecified trimester: Secondary | ICD-10-CM

## 2013-06-03 DIAGNOSIS — Z3490 Encounter for supervision of normal pregnancy, unspecified, unspecified trimester: Secondary | ICD-10-CM

## 2013-06-03 DIAGNOSIS — E669 Obesity, unspecified: Secondary | ICD-10-CM

## 2013-06-03 DIAGNOSIS — Z8751 Personal history of pre-term labor: Secondary | ICD-10-CM | POA: Insufficient documentation

## 2013-06-03 DIAGNOSIS — O09211 Supervision of pregnancy with history of pre-term labor, first trimester: Secondary | ICD-10-CM

## 2013-06-03 DIAGNOSIS — O09299 Supervision of pregnancy with other poor reproductive or obstetric history, unspecified trimester: Secondary | ICD-10-CM

## 2013-06-03 NOTE — Progress Notes (Signed)
p - [phone]

## 2013-06-03 NOTE — Patient Instructions (Signed)
Second Trimester of Pregnancy The second trimester is from week 13 through week 28, months 4 through 6. The second trimester is often a time when you feel your best. Your body has also adjusted to being pregnant, and you begin to feel better physically. Usually, morning sickness has lessened or quit completely, you may have more energy, and you may have an increase in appetite. The second trimester is also a time when the fetus is growing rapidly. At the end of the sixth month, the fetus is about 9 inches long and weighs about 1 pounds. You will likely begin to feel the baby move (quickening) between 18 and 20 weeks of the pregnancy. BODY CHANGES Your body goes through many changes during pregnancy. The changes vary from woman to woman.   Your weight will continue to increase. You will notice your lower abdomen bulging out.  You may begin to get stretch marks on your hips, abdomen, and breasts.  You may develop headaches that can be relieved by medicines approved by your caregiver.  You may urinate more often because the fetus is pressing on your bladder.  You may develop or continue to have heartburn as a result of your pregnancy.  You may develop constipation because certain hormones are causing the muscles that push waste through your intestines to slow down.  You may develop hemorrhoids or swollen, bulging veins (varicose veins).  You may have back pain because of the weight gain and pregnancy hormones relaxing your joints between the bones in your pelvis and as a result of a shift in weight and the muscles that support your balance.  Your breasts will continue to grow and be tender.  Your gums may bleed and may be sensitive to brushing and flossing.  Dark spots or blotches (chloasma, mask of pregnancy) may develop on your face. This will likely fade after the baby is born.  A dark line from your belly button to the pubic area (linea nigra) may appear. This will likely fade after the  baby is born. WHAT TO EXPECT AT YOUR PRENATAL VISITS During a routine prenatal visit:  You will be weighed to make sure you and the fetus are growing normally.  Your blood pressure will be taken.  Your abdomen will be measured to track your baby's growth.  The fetal heartbeat will be listened to.  Any test results from the previous visit will be discussed. Your caregiver may ask you:  How you are feeling.  If you are feeling the baby move.  If you have had any abnormal symptoms, such as leaking fluid, bleeding, severe headaches, or abdominal cramping.  If you have any questions. Other tests that may be performed during your second trimester include:  Blood tests that check for:  Low iron levels (anemia).  Gestational diabetes (between 24 and 28 weeks).  Rh antibodies.  Urine tests to check for infections, diabetes, or protein in the urine.  An ultrasound to confirm the proper growth and development of the baby.  An amniocentesis to check for possible genetic problems.  Fetal screens for spina bifida and Down syndrome. HOME CARE INSTRUCTIONS   Avoid all smoking, herbs, alcohol, and unprescribed drugs. These chemicals affect the formation and growth of the baby.  Follow your caregiver's instructions regarding medicine use. There are medicines that are either safe or unsafe to take during pregnancy.  Exercise only as directed by your caregiver. Experiencing uterine cramps is a good sign to stop exercising.  Continue to eat regular,   healthy meals.  Wear a good support bra for breast tenderness.  Do not use hot tubs, steam rooms, or saunas.  Wear your seat belt at all times when driving.  Avoid raw meat, uncooked cheese, cat litter boxes, and soil used by cats. These carry germs that can cause birth defects in the baby.  Take your prenatal vitamins.  Try taking a stool softener (if your caregiver approves) if you develop constipation. Eat more high-fiber foods,  such as fresh vegetables or fruit and whole grains. Drink plenty of fluids to keep your urine clear or pale yellow.  Take warm sitz baths to soothe any pain or discomfort caused by hemorrhoids. Use hemorrhoid cream if your caregiver approves.  If you develop varicose veins, wear support hose. Elevate your feet for 15 minutes, 3 4 times a day. Limit salt in your diet.  Avoid heavy lifting, wear low heel shoes, and practice good posture.  Rest with your legs elevated if you have leg cramps or low back pain.  Visit your dentist if you have not gone yet during your pregnancy. Use a soft toothbrush to brush your teeth and be gentle when you floss.  A sexual relationship may be continued unless your caregiver directs you otherwise.  Continue to go to all your prenatal visits as directed by your caregiver. SEEK MEDICAL CARE IF:   You have dizziness.  You have mild pelvic cramps, pelvic pressure, or nagging pain in the abdominal area.  You have persistent nausea, vomiting, or diarrhea.  You have a bad smelling vaginal discharge.  You have pain with urination. SEEK IMMEDIATE MEDICAL CARE IF:   You have a fever.  You are leaking fluid from your vagina.  You have spotting or bleeding from your vagina.  You have severe abdominal cramping or pain.  You have rapid weight gain or loss.  You have shortness of breath with chest pain.  You notice sudden or extreme swelling of your face, hands, ankles, feet, or legs.  You have not felt your baby move in over an hour.  You have severe headaches that do not go away with medicine.  You have vision changes. Document Released: 03/15/2001 Document Revised: 11/21/2012 Document Reviewed: 05/22/2012 [employer] Patient Information 2014 [employer].  

## 2013-06-03 NOTE — Progress Notes (Signed)
F/U anatomy today. Wants low intervention birth. Discussed options. 28 week labs at next visit.

## 2013-06-05 ENCOUNTER — Encounter: Payer: Self-pay | Admitting: Family Medicine

## 2013-06-24 ENCOUNTER — Ambulatory Visit (INDEPENDENT_AMBULATORY_CARE_PROVIDER_SITE_OTHER): Payer: 59 | Admitting: Advanced Practice Midwife

## 2013-06-24 VITALS — BP 135/76 | Wt 236.0 lb

## 2013-06-24 DIAGNOSIS — O09299 Supervision of pregnancy with other poor reproductive or obstetric history, unspecified trimester: Secondary | ICD-10-CM

## 2013-06-24 DIAGNOSIS — Z23 Encounter for immunization: Secondary | ICD-10-CM

## 2013-06-24 DIAGNOSIS — Z3480 Encounter for supervision of other normal pregnancy, unspecified trimester: Secondary | ICD-10-CM

## 2013-06-24 LAB — CBC
HEMATOCRIT: 28.9 % — AB (ref 36.0–46.0)
Hemoglobin: 10 g/dL — ABNORMAL LOW (ref 12.0–15.0)
MCH: 30 pg (ref 26.0–34.0)
MCHC: 34.6 g/dL (ref 30.0–36.0)
MCV: 86.8 fL (ref 78.0–100.0)
PLATELETS: 220 10*3/uL (ref 150–400)
RBC: 3.33 MIL/uL — ABNORMAL LOW (ref 3.87–5.11)
RDW: 14.7 % (ref 11.5–15.5)
WBC: 8.3 10*3/uL (ref 4.0–10.5)

## 2013-06-24 LAB — RPR

## 2013-06-24 MED ORDER — TETANUS-DIPHTH-ACELL PERTUSSIS 5-2.5-18.5 LF-MCG/0.5 IM SUSP
0.5000 mL | Freq: Once | INTRAMUSCULAR | Status: AC
  Administered 2013-06-24: 0.5 mL via INTRAMUSCULAR

## 2013-06-24 NOTE — Addendum Note (Signed)
Addended by: Fatima Blank A on: 06/24/2013 10:25 AM   Modules accepted: Level of Service

## 2013-06-24 NOTE — Addendum Note (Signed)
Addended by: Asencion Islam on: 06/24/2013 10:38 AM   Modules accepted: Orders

## 2013-06-24 NOTE — Progress Notes (Signed)
P - 95 - Pt states she has experienced lower abd cramping and doesn't think its contractions - Pt to rcv 28 week labs today

## 2013-06-24 NOTE — Progress Notes (Signed)
Doing well.  Good fetal movement, denies vaginal bleeding, LOF.  Does reports a couple of episodes of intermittent cramping, mostly low on right side, last episode x1 hour this morning.  Hx 32 week delivery but IOL for preeclampsia.  Cervix 0/long/high today. Reassurance provided, PTL precautions given.  Pt reports increase in edema over last 2 weeks, better in am, worsening over day in ankles and hands.  Discussed using support socks/stockings, decreasing salt intake, increasing water intake.  Preeclampsia precautions given.  Pt denies h/a, epigastric pain, visual disturbances today.

## 2013-06-25 ENCOUNTER — Telehealth: Payer: Self-pay | Admitting: *Deleted

## 2013-06-25 LAB — GLUCOSE TOLERANCE, 1 HOUR (50G) W/O FASTING: Glucose, 1 Hour GTT: 167 mg/dL — ABNORMAL HIGH (ref 70–140)

## 2013-06-25 LAB — HIV ANTIBODY (ROUTINE TESTING W REFLEX): HIV: NONREACTIVE

## 2013-06-25 NOTE — Telephone Encounter (Signed)
LM on voicemail that she did not plass her 1 hr GTT and she needs appt for a fasting 3 hr GTT.  Pt to call office to schedule.

## 2013-06-26 ENCOUNTER — Other Ambulatory Visit: Payer: 59 | Admitting: *Deleted

## 2013-06-26 DIAGNOSIS — R7309 Other abnormal glucose: Secondary | ICD-10-CM

## 2013-06-26 NOTE — Progress Notes (Signed)
Pt here for 3HrGTT due to 1HrGTT being abnormal

## 2013-06-27 ENCOUNTER — Telehealth: Payer: Self-pay | Admitting: *Deleted

## 2013-06-27 ENCOUNTER — Encounter: Payer: Self-pay | Admitting: Obstetrics & Gynecology

## 2013-06-27 DIAGNOSIS — O24419 Gestational diabetes mellitus in pregnancy, unspecified control: Secondary | ICD-10-CM | POA: Insufficient documentation

## 2013-06-27 LAB — GLUCOSE TOLERANCE, 3 HOURS
GLUCOSE 3 HOUR GTT: 88 mg/dL (ref 70–144)
GLUCOSE, FASTING-GESTATIONAL: 88 mg/dL (ref 70–104)
Glucose Tolerance, 1 hour: 205 mg/dL — ABNORMAL HIGH (ref 70–189)
Glucose Tolerance, 2 hour: 203 mg/dL — ABNORMAL HIGH (ref 70–164)

## 2013-06-27 NOTE — Telephone Encounter (Signed)
Pt notified of abn 3 hr GTT and [facility].

## 2013-07-03 ENCOUNTER — Telehealth: Payer: Self-pay | Admitting: *Deleted

## 2013-07-03 ENCOUNTER — Encounter: Payer: 59 | Attending: Advanced Practice Midwife

## 2013-07-03 VITALS — Ht 63.0 in | Wt 235.7 lb

## 2013-07-03 DIAGNOSIS — Z713 Dietary counseling and surveillance: Secondary | ICD-10-CM | POA: Insufficient documentation

## 2013-07-03 DIAGNOSIS — O99810 Abnormal glucose complicating pregnancy: Secondary | ICD-10-CM | POA: Insufficient documentation

## 2013-07-03 DIAGNOSIS — O24419 Gestational diabetes mellitus in pregnancy, unspecified control: Secondary | ICD-10-CM

## 2013-07-03 MED ORDER — GLUCOSE BLOOD VI STRP: ORAL_STRIP | Status: DC

## 2013-07-03 MED ORDER — ACCU-CHEK MULTICLIX LANCETS MISC: 1.0000 | Freq: Four times a day (QID) | Status: DC

## 2013-07-03 NOTE — Telephone Encounter (Signed)
[facility] today for gestational diabetes.  Lancets and Glucose strips sent to pharmacy.

## 2013-07-03 NOTE — Progress Notes (Signed)
  Patient was seen on 07/03/13 for Gestational Diabetes self-management class at [facility]. The following learning objectives were met by the patient during this course:   States the definition of Gestational Diabetes  States why dietary management is important in controlling blood glucose  Describes the effects of carbohydrates on blood glucose levels  Demonstrates ability to create a balanced meal plan  Demonstrates carbohydrate counting   States when to check blood glucose levels  Demonstrates proper blood glucose monitoring techniques  States the effect of stress and exercise on blood glucose levels  States the importance of limiting caffeine and abstaining from alcohol and smoking  Plan:  Aim for 2 Carb Choices per meal (30 grams) +/- 1 either way for breakfast Aim for 3 Carb Choices per meal (45 grams) +/- 1 either way from lunch and dinner Aim for 1-2 Carbs per snack Begin reading food labels for Total Carbohydrate and sugar grams of foods Consider  increasing your activity level by walking daily as tolerated Begin checking BG before breakfast and 1-2 hours after first bit of breakfast, lunch and dinner after  as directed by MD  Take medication  as directed by MD  Blood glucose monitor given:  One Touch Ultra Mini Self Monitoring Kit Lot # [id number] X Exp: 8/15 Blood glucose reading: $RemoveBeforeDE'90mg'gVZUakPLfvOzRRV$ /dl  Patient instructed to monitor glucose levels: FBS: 60 - <90 2 hour: <120  Patient received the following handouts:  Nutrition Diabetes and Pregnancy  Carbohydrate Counting List  Meal Planning worksheet  Patient will be seen for follow-up as needed.

## 2013-07-08 ENCOUNTER — Encounter: Payer: Self-pay | Admitting: Family

## 2013-07-08 ENCOUNTER — Ambulatory Visit (INDEPENDENT_AMBULATORY_CARE_PROVIDER_SITE_OTHER): Payer: 59 | Admitting: Family

## 2013-07-08 VITALS — BP 124/73 | Wt 235.0 lb

## 2013-07-08 DIAGNOSIS — Z3480 Encounter for supervision of other normal pregnancy, unspecified trimester: Secondary | ICD-10-CM

## 2013-07-08 DIAGNOSIS — O99810 Abnormal glucose complicating pregnancy: Secondary | ICD-10-CM

## 2013-07-08 DIAGNOSIS — O24419 Gestational diabetes mellitus in pregnancy, unspecified control: Secondary | ICD-10-CM

## 2013-07-08 NOTE — Progress Notes (Signed)
p-100

## 2013-07-08 NOTE — Progress Notes (Signed)
FBS 80-102 (1/5 abnl); B 94-120, L 102-118, D 95-148 (3/5 abnl); reviewed diet on high numbers in dinner, has made adjustments.  Discussed implication of GDM on pregnancy.   Pt and spouse with no issues regarding care and will do what is needed for baby.  Obtain growth [place] due to fundal height.

## 2013-07-15 ENCOUNTER — Ambulatory Visit (HOSPITAL_COMMUNITY): Payer: 59

## 2013-07-18 ENCOUNTER — Ambulatory Visit (HOSPITAL_COMMUNITY)
Admission: RE | Admit: 2013-07-18 | Discharge: 2013-07-18 | Disposition: A | Discharge status: Home / Self Care | Payer: 59 | Source: Ambulatory Visit | Attending: Family | Admitting: Family

## 2013-07-18 DIAGNOSIS — O352XX0 Maternal care for (suspected) hereditary disease in fetus, not applicable or unspecified: Secondary | ICD-10-CM | POA: Insufficient documentation

## 2013-07-18 DIAGNOSIS — Z8751 Personal history of pre-term labor: Secondary | ICD-10-CM | POA: Insufficient documentation

## 2013-07-18 DIAGNOSIS — O09299 Supervision of pregnancy with other poor reproductive or obstetric history, unspecified trimester: Secondary | ICD-10-CM | POA: Insufficient documentation

## 2013-07-18 DIAGNOSIS — O24419 Gestational diabetes mellitus in pregnancy, unspecified control: Secondary | ICD-10-CM

## 2013-07-22 ENCOUNTER — Encounter: Payer: Self-pay | Admitting: Advanced Practice Midwife

## 2013-07-22 ENCOUNTER — Ambulatory Visit (INDEPENDENT_AMBULATORY_CARE_PROVIDER_SITE_OTHER): Payer: 59 | Admitting: Advanced Practice Midwife

## 2013-07-22 VITALS — BP 122/79 | Wt 241.0 lb

## 2013-07-22 DIAGNOSIS — Z3480 Encounter for supervision of other normal pregnancy, unspecified trimester: Secondary | ICD-10-CM

## 2013-07-22 DIAGNOSIS — O321XX0 Maternal care for breech presentation, not applicable or unspecified: Secondary | ICD-10-CM

## 2013-07-22 DIAGNOSIS — O479 False labor, unspecified: Secondary | ICD-10-CM

## 2013-07-22 DIAGNOSIS — O4700 False labor before 37 completed weeks of gestation, unspecified trimester: Secondary | ICD-10-CM

## 2013-07-22 LAB — FETAL FIBRONECTIN: FETAL FIBRONECTIN: NEGATIVE

## 2013-07-22 NOTE — Progress Notes (Signed)
Fasting 82-95, PP 92-121. Moderate contractions Q4-5 this morning. FFN. Plan BMZ if pos.

## 2013-07-22 NOTE — Progress Notes (Signed)
P - 97

## 2013-07-23 ENCOUNTER — Telehealth: Payer: Self-pay

## 2013-07-23 NOTE — Telephone Encounter (Signed)
Spoke with patient to let her know fetal fibronectin test was negative.

## 2013-08-02 ENCOUNTER — Telehealth: Payer: Self-pay | Admitting: *Deleted

## 2013-08-02 NOTE — Telephone Encounter (Signed)
Pt called in states she is out of town on a trip and has experienced pitting edema in LE with no relief after elevation and rest. Pt also states BP reading was 174/94. Pt has had irregular contractions, no bleeding, no change in discharge. I told pt to report to closest hospital to be evaluated since BP is elevated. Pt states understanding and will be home tomorrow. Pt states she has appt at [facility] on Monday

## 2013-08-03 ENCOUNTER — Encounter (HOSPITAL_COMMUNITY): Payer: Self-pay

## 2013-08-03 ENCOUNTER — Observation Stay (HOSPITAL_COMMUNITY)
Admission: AD | Admit: 2013-08-03 | Discharge: 2013-08-05 | Disposition: A | Discharge status: Home / Self Care | Payer: 59 | Source: Ambulatory Visit | Attending: Obstetrics & Gynecology | Admitting: Obstetrics & Gynecology

## 2013-08-03 ENCOUNTER — Observation Stay (HOSPITAL_COMMUNITY): Payer: 59

## 2013-08-03 DIAGNOSIS — O4700 False labor before 37 completed weeks of gestation, unspecified trimester: Secondary | ICD-10-CM

## 2013-08-03 DIAGNOSIS — IMO0002 Reserved for concepts with insufficient information to code with codable children: Secondary | ICD-10-CM

## 2013-08-03 DIAGNOSIS — O24419 Gestational diabetes mellitus in pregnancy, unspecified control: Secondary | ICD-10-CM

## 2013-08-03 DIAGNOSIS — E669 Obesity, unspecified: Secondary | ICD-10-CM

## 2013-08-03 DIAGNOSIS — Z8279 Family history of other congenital malformations, deformations and chromosomal abnormalities: Secondary | ICD-10-CM

## 2013-08-03 DIAGNOSIS — O09299 Supervision of pregnancy with other poor reproductive or obstetric history, unspecified trimester: Secondary | ICD-10-CM

## 2013-08-03 DIAGNOSIS — O321XX0 Maternal care for breech presentation, not applicable or unspecified: Secondary | ICD-10-CM

## 2013-08-03 DIAGNOSIS — O368190 Decreased fetal movements, unspecified trimester, not applicable or unspecified: Secondary | ICD-10-CM

## 2013-08-03 DIAGNOSIS — O99210 Obesity complicating pregnancy, unspecified trimester: Secondary | ICD-10-CM

## 2013-08-03 DIAGNOSIS — O09211 Supervision of pregnancy with history of pre-term labor, first trimester: Secondary | ICD-10-CM

## 2013-08-03 DIAGNOSIS — O1490 Unspecified pre-eclampsia, unspecified trimester: Secondary | ICD-10-CM

## 2013-08-03 DIAGNOSIS — Z6830 Body mass index (BMI) 30.0-30.9, adult: Secondary | ICD-10-CM | POA: Insufficient documentation

## 2013-08-03 DIAGNOSIS — Z3490 Encounter for supervision of normal pregnancy, unspecified, unspecified trimester: Secondary | ICD-10-CM

## 2013-08-03 DIAGNOSIS — O1400 Mild to moderate pre-eclampsia, unspecified trimester: Secondary | ICD-10-CM | POA: Diagnosis present

## 2013-08-03 DIAGNOSIS — O99810 Abnormal glucose complicating pregnancy: Secondary | ICD-10-CM | POA: Insufficient documentation

## 2013-08-03 HISTORY — DX: Unspecified pre-eclampsia, unspecified trimester: O14.90

## 2013-08-03 HISTORY — DX: Gestational diabetes mellitus in pregnancy, unspecified control: O24.419

## 2013-08-03 LAB — URINALYSIS, ROUTINE W REFLEX MICROSCOPIC
BILIRUBIN URINE: NEGATIVE
Glucose, UA: NEGATIVE mg/dL
Ketones, ur: NEGATIVE mg/dL
LEUKOCYTES UA: NEGATIVE
Nitrite: NEGATIVE
PH: 6 (ref 5.0–8.0)
Protein, ur: NEGATIVE mg/dL
Specific Gravity, Urine: >1.03 — ABNORMAL HIGH (ref 1.005–1.030)
UROBILINOGEN UA: 0.2 mg/dL (ref 0.0–1.0)

## 2013-08-03 LAB — COMPREHENSIVE METABOLIC PANEL
ALBUMIN: 2.5 g/dL — AB (ref 3.5–5.2)
ALK PHOS: 88 U/L (ref 39–117)
ALT: 12 U/L (ref 0–35)
AST: 15 U/L (ref 0–37)
BUN: 14 mg/dL (ref 6–23)
CALCIUM: 8.7 mg/dL (ref 8.4–10.5)
CO2: 19 mEq/L (ref 19–32)
Chloride: 101 mEq/L (ref 96–112)
Creatinine, Ser: 0.64 mg/dL (ref 0.50–1.10)
GFR calc Af Amer: >90 mL/min (ref >90)
GFR calc non Af Amer: >90 mL/min (ref >90)
Glucose, Bld: 117 mg/dL — ABNORMAL HIGH (ref 70–99)
POTASSIUM: 4.3 meq/L (ref 3.7–5.3)
SODIUM: 137 meq/L (ref 137–147)
TOTAL PROTEIN: 6 g/dL (ref 6.0–8.3)
Total Bilirubin: <0.2 mg/dL — ABNORMAL LOW (ref 0.3–1.2)

## 2013-08-03 LAB — URINE MICROSCOPIC-ADD ON

## 2013-08-03 LAB — PROTEIN / CREATININE RATIO, URINE
CREATININE, URINE: 123.19 mg/dL
PROTEIN CREATININE RATIO: 0.32 — AB (ref 0.00–0.15)
TOTAL PROTEIN, URINE: 39.2 mg/dL

## 2013-08-03 LAB — CBC
HCT: 31 % — ABNORMAL LOW (ref 36.0–46.0)
HEMOGLOBIN: 10.4 g/dL — AB (ref 12.0–15.0)
MCH: 29.4 pg (ref 26.0–34.0)
MCHC: 33.5 g/dL (ref 30.0–36.0)
MCV: 87.6 fL (ref 78.0–100.0)
Platelets: 195 10*3/uL (ref 150–400)
RBC: 3.54 MIL/uL — ABNORMAL LOW (ref 3.87–5.11)
RDW: 14.9 % (ref 11.5–15.5)
WBC: 9.6 10*3/uL (ref 4.0–10.5)

## 2013-08-03 MED ORDER — DOCUSATE SODIUM 100 MG PO CAPS
100.0000 mg | ORAL_CAPSULE | Freq: Every day | ORAL | Status: DC
  Administered 2013-08-04 – 2013-08-05 (×2): 100 mg via ORAL
  Filled 2013-08-03 (×5): qty 1

## 2013-08-03 MED ORDER — ACETAMINOPHEN 325 MG PO TABS
650.0000 mg | ORAL_TABLET | Freq: Once | ORAL | Status: AC
  Administered 2013-08-03: 650 mg via ORAL
  Filled 2013-08-03: qty 2

## 2013-08-03 MED ORDER — ACETAMINOPHEN 325 MG PO TABS
650.0000 mg | ORAL_TABLET | ORAL | Status: DC | PRN
  Administered 2013-08-04 – 2013-08-05 (×5): 650 mg via ORAL
  Filled 2013-08-03 (×6): qty 2

## 2013-08-03 MED ORDER — ZOLPIDEM TARTRATE 5 MG PO TABS: 5.0000 mg | ORAL_TABLET | Freq: Every evening | ORAL | Status: DC | PRN

## 2013-08-03 MED ORDER — CALCIUM CARBONATE ANTACID 500 MG PO CHEW
2.0000 | CHEWABLE_TABLET | ORAL | Status: DC | PRN
  Filled 2013-08-03: qty 2

## 2013-08-03 MED ORDER — BETAMETHASONE SOD PHOS & ACET 6 (3-3) MG/ML IJ SUSP
12.0000 mg | Freq: Once | INTRAMUSCULAR | Status: AC
  Administered 2013-08-03: 12 mg via INTRAMUSCULAR
  Filled 2013-08-03: qty 2

## 2013-08-03 MED ORDER — BETAMETHASONE SOD PHOS & ACET 6 (3-3) MG/ML IJ SUSP
12.0000 mg | Freq: Once | INTRAMUSCULAR | Status: AC
  Administered 2013-08-04: 12 mg via INTRAMUSCULAR
  Filled 2013-08-03: qty 2

## 2013-08-03 MED ORDER — PRENATAL MULTIVITAMIN CH
1.0000 | ORAL_TABLET | Freq: Every day | ORAL | Status: DC
  Administered 2013-08-04 – 2013-08-05 (×2): 1 via ORAL
  Filled 2013-08-03 (×4): qty 1

## 2013-08-03 NOTE — H&P (Signed)
History    Chief Complaint:  No chief complaint on file. Olivia Moon is a 33 y.o. [phone] female at [redacted]w[redacted]d presenting w/ report of going to [place] for conference, developed increased swelling in feet/hands/face, nurse there checked bp and it was elevated- so went to ED where they said she had a trace of proteinuria and told her to f/u here when she returned today.  Got off airplane around 1530, had bad headache, so went to local pharmacy, bp was 150s/90s, so came here to be evaluated. Reports frontal headache- hasn't taken apap, some blurred vision yesterday- none today. Denies ruq/epigastric pain. Some nausea, no vomiting. A1DM, fbs all <90, 2hr pp all <120. Was breech at 31wks.   Reports decreased fetal movement, contractions: irregular, vaginal bleeding: none, membranes: intact. Denies uti s/s, abnormal/malodorous vag d/c or vulvovaginal itching/irritation.    [facility] at [facility].  Next visit Monday. Pregnancy complicated by O8CZ, h/o ptb @ 33wks d/t severe pre-e, breech @ 31wks, obesity.   Last u/s 4/16 @ 31.3wks: efw 2008g/4lb7oz/76%, frank breech, afi 8.7   Obstetrical History: OB History     Grav  Para  Term  Preterm  Abortions  TAB  SAB  Ect  Mult  Living     4  2  1  1  1    1      2          Past Medical History: Past Medical History   Diagnosis  Date   .  Gestational diabetes mellitus, antepartum     .  Preeclampsia         "with first pregnancy"   .  Gestational diabetes        Past Surgical History: Past Surgical History   Procedure  Laterality  Date   .  None       .  No past surgeries          Social History: History       Social History   .  Marital Status:  Married       Spouse Name:  N/A       Number of Children:  N/A   .  Years of Education:  N/A       Social History Main Topics   .  Smoking status:  Never Smoker    .  Smokeless tobacco:  Never Used   .  Alcohol Use:  No         Comment: before pregnancy   .  Drug Use:  No   .  Sexual Activity:   Yes       Birth Control/ Protection:  None       Other Topics  Concern   .  None       Social History Narrative   .  None      Allergies: Allergies   Allergen  Reactions   .  Aspirin  Anaphylaxis       Prescriptions prior to admission   Medication  Sig  Dispense  Refill   .  Prenatal Vit-Fe Fumarate-FA (PRENATAL MULTIVITAMIN) TABS tablet  Take 1 tablet by mouth daily at 12 noon.            Review of Systems   Pertinent pos/neg as indicated in HPI    Physical Exam    Blood pressure 133/79, pulse 87, temperature 97.3 F (36.3 C), temperature source Oral, resp. rate 18, height 5' 3.5" (1.613 m), weight 112.152 kg (247 lb 4  oz), last menstrual period 11/25/2012. BPs: 137/79, 141/73, 133/79 General appearance: alert, cooperative and no distress Lungs: clear to auscultation bilaterally, normal effort Heart: regular rate and rhythm Abdomen: gravid, soft, non-tender Extremities: 3+ edema DTR's 2+ No clonus   SVE: outer os 1, unable to reach inner os, posterior Presentation: unsure   Fetal monitoring: FHR: 135 bpm, variability: moderate,  Accelerations: Present,  decelerations:  Absent Uterine activity: none    MAU Course    Exam Pre-e labs UA   Labs:   Results for orders placed during the hospital encounter of 08/03/13 (from the past 24 hour(s))   URINALYSIS, ROUTINE W REFLEX MICROSCOPIC     Status: Abnormal     Collection Time      08/03/13  7:05 PM       Result  Value  Ref Range     Color, Urine  YELLOW   YELLOW     APPearance  CLEAR   CLEAR     Specific Gravity, Urine  >1.030 (*)  1.005 - 1.030     pH  6.0   5.0 - 8.0     Glucose, UA  NEGATIVE   NEGATIVE mg/dL     Hgb urine dipstick  SMALL (*)  NEGATIVE     Bilirubin Urine  NEGATIVE   NEGATIVE     Ketones, ur  NEGATIVE   NEGATIVE mg/dL     Protein, ur  NEGATIVE   NEGATIVE mg/dL     Urobilinogen, UA  0.2   0.0 - 1.0 mg/dL     Nitrite  NEGATIVE   NEGATIVE     Leukocytes, UA  NEGATIVE   NEGATIVE    PROTEIN / CREATININE RATIO, URINE     Status: Abnormal     Collection Time      08/03/13  7:05 PM       Result  Value  Ref Range     Creatinine, Urine  123.19        Total Protein, Urine  39.2        PROTEIN CREATININE RATIO  0.32 (*)  0.00 - 0.15   URINE MICROSCOPIC-ADD ON     Status: Abnormal     Collection Time      08/03/13  7:05 PM       Result  Value  Ref Range     Squamous Epithelial / LPF  RARE   RARE     WBC, UA  0-2   <3 WBC/hpf     RBC / HPF  3-6   <3 RBC/hpf     Bacteria, UA  FEW (*)  RARE   CBC     Status: Abnormal     Collection Time      08/03/13  7:50 PM       Result  Value  Ref Range     WBC  9.6   4.0 - 10.5 K/uL     RBC  3.54 (*)  3.87 - 5.11 MIL/uL     Hemoglobin  10.4 (*)  12.0 - 15.0 g/dL     HCT  31.0 (*)  36.0 - 46.0 %     MCV  87.6   78.0 - 100.0 fL     MCH  29.4   26.0 - 34.0 pg     MCHC  33.5   30.0 - 36.0 g/dL     RDW  14.9   11.5 - 15.5 %     Platelets  195   150 -  400 K/uL   COMPREHENSIVE METABOLIC PANEL     Status: Abnormal     Collection Time      08/03/13  7:50 PM       Result  Value  Ref Range     Sodium  137   137 - 147 mEq/L     Potassium  4.3   3.7 - 5.3 mEq/L     Chloride  101   96 - 112 mEq/L     CO2  19   19 - 32 mEq/L     Glucose, Bld  117 (*)  70 - 99 mg/dL     BUN  14   6 - 23 mg/dL     Creatinine, Ser  0.64   0.50 - 1.10 mg/dL     Calcium  8.7   8.4 - 10.5 mg/dL     Total Protein  6.0   6.0 - 8.3 g/dL     Albumin  2.5 (*)  3.5 - 5.2 g/dL     AST  15   0 - 37 U/L     ALT  12   0 - 35 U/L     Alkaline Phosphatase  88   39 - 117 U/L     Total Bilirubin  <0.2 (*)  0.3 - 1.2 mg/dL     GFR calc non Af Amer  >90   >90 mL/min     GFR calc Af Amer  >90   >90 mL/min      Imaging:  Pending for presentation    Assessment and Plan    A:             [redacted]w[redacted]d [facility]             [id number]             Pre-e               H/O 33wk birth d/t severe pre-e             Cat 1 FHR             A1DM- well controlled P:             Discussed  w/ Dr. Hulan Fray             Admit for observation             Carb mod diet             QID cbg's             BMZ x 2- expect cbg's to increase- will monitor                          Tawnya Crook CNM,WHNP-BC 5/2/20159:19 PM

## 2013-08-03 NOTE — MAU Provider Note (Signed)
History    Chief Complaint:  No chief complaint on file. Olivia Moon is a 33 y.o. [id number] female at [redacted]w[redacted]d presenting w/ report of going to [place] for conference, developed increased swelling in feet/hands/face, nurse there checked bp and it was elevated- so went to ED where they said she had a trace of proteinuria and told her to f/u here when she returned today.  Got off airplane around 1530, had bad headache, so went to local pharmacy, bp was 150s/90s, so came here to be evaluated. Reports frontal headache- hasn't taken apap, some blurred vision yesterday- none today. Denies ruq/epigastric pain. Some nausea, no vomiting. A1DM, fbs all <90, 2hr pp all <120. Was breech at 31wks.   Reports decreased fetal movement, contractions: irregular, vaginal bleeding: none, membranes: intact. Denies uti s/s, abnormal/malodorous vag d/c or vulvovaginal itching/irritation.    Prenatal care at [facility].  Next visit Monday. Pregnancy complicated by A1DM, h/o ptb @ 33wks d/t severe pre-e, breech @ 31wks, obesity.   Last u/s 4/16 @ 31.3wks: efw 2008g/4lb7oz/76%, frank breech, afi 8.7   Obstetrical History: OB History     Grav  Para  Term  Preterm  Abortions  TAB  SAB  Ect  Mult  Living     4  2  1  1  1    1      2         Past Medical History: Past Medical History   Diagnosis  Date   .  Gestational diabetes mellitus, antepartum     .  Preeclampsia         "with first pregnancy"   .  Gestational diabetes        Past Surgical History: Past Surgical History   Procedure  Laterality  Date   .  None       .  No past surgeries          Social History: History       Social History   .  Marital Status:  Married       Spouse Name:  N/A       Number of Children:  N/A   .  Years of Education:  N/A       Social History Main Topics   .  Smoking status:  Never Smoker    .  Smokeless tobacco:  Never Used   .  Alcohol Use:  No         Comment: before pregnancy   .  Drug Use:  No   .  Sexual Activity:   Yes       Birth Control/ Protection:  None       Other Topics  Concern   .  None       Social History Narrative   .  None      Allergies: Allergies   Allergen  Reactions   .  Aspirin  Anaphylaxis       Prescriptions prior to admission   Medication  Sig  Dispense  Refill   .  Prenatal Vit-Fe Fumarate-FA (PRENATAL MULTIVITAMIN) TABS tablet  Take 1 tablet by mouth daily at 12 noon.            Review of Systems   Pertinent pos/neg as indicated in HPI    Physical Exam    Blood pressure 133/79, pulse 87, temperature 97.3 F (36.3 C), temperature source Oral, resp. rate 18, height 5' 3.5" (1.613 m), weight 112.152 kg (247 lb 4   oz), last menstrual period 11/25/2012. BPs: 137/79, 141/73, 133/79 General appearance: alert, cooperative and no distress Lungs: clear to auscultation bilaterally, normal effort Heart: regular rate and rhythm Abdomen: gravid, soft, non-tender Extremities: 3+ edema DTR's 2+ No clonus   SVE: outer os 1, unable to reach inner os, posterior Presentation: unsure   Fetal monitoring: FHR: 135 bpm, variability: moderate,  Accelerations: Present,  decelerations:  Absent Uterine activity: none    MAU Course    Exam Pre-e labs UA   Labs:   Results for orders placed during the hospital encounter of 08/03/13 (from the past 24 hour(s))   URINALYSIS, ROUTINE W REFLEX MICROSCOPIC     Status: Abnormal     Collection Time      08/03/13  7:05 PM       Result  Value  Ref Range     Color, Urine  YELLOW   YELLOW     APPearance  CLEAR   CLEAR     Specific Gravity, Urine  >1.030 (*)  1.005 - 1.030     pH  6.0   5.0 - 8.0     Glucose, UA  NEGATIVE   NEGATIVE mg/dL     Hgb urine dipstick  SMALL (*)  NEGATIVE     Bilirubin Urine  NEGATIVE   NEGATIVE     Ketones, ur  NEGATIVE   NEGATIVE mg/dL     Protein, ur  NEGATIVE   NEGATIVE mg/dL     Urobilinogen, UA  0.2   0.0 - 1.0 mg/dL     Nitrite  NEGATIVE   NEGATIVE     Leukocytes, UA  NEGATIVE   NEGATIVE    PROTEIN / CREATININE RATIO, URINE     Status: Abnormal     Collection Time      08/03/13  7:05 PM       Result  Value  Ref Range     Creatinine, Urine  123.19        Total Protein, Urine  39.2        PROTEIN CREATININE RATIO  0.32 (*)  0.00 - 0.15   URINE MICROSCOPIC-ADD ON     Status: Abnormal     Collection Time      08/03/13  7:05 PM       Result  Value  Ref Range     Squamous Epithelial / LPF  RARE   RARE     WBC, UA  0-2   <3 WBC/hpf     RBC / HPF  3-6   <3 RBC/hpf     Bacteria, UA  FEW (*)  RARE   CBC     Status: Abnormal     Collection Time      08/03/13  7:50 PM       Result  Value  Ref Range     WBC  9.6   4.0 - 10.5 K/uL     RBC  3.54 (*)  3.87 - 5.11 MIL/uL     Hemoglobin  10.4 (*)  12.0 - 15.0 g/dL     HCT  31.0 (*)  36.0 - 46.0 %     MCV  87.6   78.0 - 100.0 fL     MCH  29.4   26.0 - 34.0 pg     MCHC  33.5   30.0 - 36.0 g/dL     RDW  14.9   11.5 - 15.5 %     Platelets  195   150 -

## 2013-08-04 LAB — GLUCOSE, CAPILLARY
GLUCOSE-CAPILLARY: 137 mg/dL — AB (ref 70–99)
GLUCOSE-CAPILLARY: 141 mg/dL — AB (ref 70–99)
GLUCOSE-CAPILLARY: 145 mg/dL — AB (ref 70–99)
Glucose-Capillary: 134 mg/dL — ABNORMAL HIGH (ref 70–99)
Glucose-Capillary: 119 mg/dL — ABNORMAL HIGH (ref 70–99)

## 2013-08-04 LAB — TYPE AND SCREEN
ABO/RH(D): B POS
Antibody Screen: NEGATIVE

## 2013-08-04 MED ORDER — GLYBURIDE 2.5 MG PO TABS
2.5000 mg | ORAL_TABLET | Freq: Two times a day (BID) | ORAL | Status: DC
  Administered 2013-08-04 – 2013-08-05 (×2): 2.5 mg via ORAL
  Filled 2013-08-04 (×4): qty 1

## 2013-08-04 NOTE — Progress Notes (Signed)
24 hour urine explained to pt and husband.

## 2013-08-04 NOTE — Progress Notes (Signed)
Patient ID: Olivia Moon, female   DOB: 1980/07/30, 33 y.o.   MRN: [id number] [place] COMPREHENSIVE PROGRESS NOTE  Olivia Moon is a 33 y.o. [id number] at [redacted]w[redacted]d  who is admitted for pre-eclampsia.  Estimated Date of Delivery: 09/16/13 Fetal presentation is breech.  Length of Stay:  1 Days. 08/03/2013  Subjective: Pt doing well this AM. Still has an on and off headache but resolves with tylenol. +FM. No LOF, VB. Occasional ctx but improved today compared to yesterday.   No blurry vision, RUQ pain.     Vitals:  Blood pressure 105/62, pulse 95, temperature 98 F (36.7 C), temperature source Oral, resp. rate 18, height 5' 3.5" (1.613 m), weight 112.152 kg (247 lb 4 oz), last menstrual period 11/25/2012. Physical Examination: General appearance - alert, well appearing, and in no distress Chest - clear to auscultation, no wheezes, rales or rhonchi, symmetric air entry Heart - normal rate and regular rhythm Abdomen - soft, nontender, nondistended, no masses or organomegaly Extremities - peripheral pulses normal, 1+ edema ot the mid calf, no clubbing or cyanosis NEURO: 2+ DTRs  fetal presentation is breech. Membranes:intact  Fetal Monitoring:  Baseline: 150 bpm, Variability: Good {> 6 bpm), Accelerations: Reactive and Decelerations: Absent  Results for orders placed during the hospital encounter of 08/03/13 (from the past 24 hour(s))  URINALYSIS, ROUTINE W REFLEX MICROSCOPIC   Collection Time    08/03/13  7:05 PM      Result Value Ref Range   Color, Urine YELLOW  YELLOW   APPearance CLEAR  CLEAR   Specific Gravity, Urine >1.030 (*) 1.005 - 1.030   pH 6.0  5.0 - 8.0   Glucose, UA NEGATIVE  NEGATIVE mg/dL   Hgb urine dipstick SMALL (*) NEGATIVE   Bilirubin Urine NEGATIVE  NEGATIVE   Ketones, ur NEGATIVE  NEGATIVE mg/dL   Protein, ur NEGATIVE  NEGATIVE mg/dL   Urobilinogen, UA 0.2  0.0 - 1.0 mg/dL   Nitrite NEGATIVE  NEGATIVE   Leukocytes, UA NEGATIVE  NEGATIVE  PROTEIN  / CREATININE RATIO, URINE   Collection Time    08/03/13  7:05 PM      Result Value Ref Range   Creatinine, Urine 123.19     Total Protein, Urine 39.2     PROTEIN CREATININE RATIO 0.32 (*) 0.00 - 0.15  URINE MICROSCOPIC-ADD ON   Collection Time    08/03/13  7:05 PM      Result Value Ref Range   Squamous Epithelial / LPF RARE  RARE   WBC, UA 0-2  <3 WBC/hpf   RBC / HPF 3-6  <3 RBC/hpf   Bacteria, UA FEW (*) RARE  CBC   Collection Time    08/03/13  7:50 PM      Result Value Ref Range   WBC 9.6  4.0 - 10.5 K/uL   RBC 3.54 (*) 3.87 - 5.11 MIL/uL   Hemoglobin 10.4 (*) 12.0 - 15.0 g/dL   HCT 31.0 (*) 36.0 - 46.0 %   MCV 87.6  78.0 - 100.0 fL   MCH 29.4  26.0 - 34.0 pg   MCHC 33.5  30.0 - 36.0 g/dL   RDW 14.9  11.5 - 15.5 %   Platelets 195  150 - 400 K/uL  COMPREHENSIVE METABOLIC PANEL   Collection Time    08/03/13  7:50 PM      Result Value Ref Range   Sodium 137  137 - 147 mEq/L   Potassium 4.3  3.7 - 5.3  mEq/L   Chloride 101  96 - 112 mEq/L   CO2 19  19 - 32 mEq/L   Glucose, Bld 117 (*) 70 - 99 mg/dL   BUN 14  6 - 23 mg/dL   Creatinine, Ser 0.64  0.50 - 1.10 mg/dL   Calcium 8.7  8.4 - 10.5 mg/dL   Total Protein 6.0  6.0 - 8.3 g/dL   Albumin 2.5 (*) 3.5 - 5.2 g/dL   AST 15  0 - 37 U/L   ALT 12  0 - 35 U/L   Alkaline Phosphatase 88  39 - 117 U/L   Total Bilirubin <0.2 (*) 0.3 - 1.2 mg/dL   GFR calc non Af Amer >90  >90 mL/min   GFR calc Af Amer >90  >90 mL/min  TYPE AND SCREEN   Collection Time    08/03/13  7:50 PM      Result Value Ref Range   ABO/RH(D) B POS     Antibody Screen NEG     Sample Expiration 08/06/2013    GLUCOSE, CAPILLARY   Collection Time    08/04/13  6:59 AM      Result Value Ref Range   Glucose-Capillary 137 (*) 70 - 99 mg/dL  GLUCOSE, CAPILLARY   Collection Time    08/04/13  8:02 AM      Result Value Ref Range   Glucose-Capillary 134 (*) 70 - 99 mg/dL  GLUCOSE, CAPILLARY   Collection Time    08/04/13 10:45 AM      Result Value Ref Range    Glucose-Capillary 141 (*) 70 - 99 mg/dL    No results found.  . betamethasone acetate-betamethasone sodium phosphate  12 mg Intramuscular Once  . docusate sodium  100 mg Oral Daily  . prenatal multivitamin  1 tablet Oral Q1200   I have reviewed the patient's current medications.  ASSESSMENT: Patient Active Problem List   Diagnosis Date Noted  . Preeclampsia 08/03/2013  . Breech presentation, antepartum 07/22/2013  . Gestational diabetes mellitus 06/27/2013  . Supervision of normal pregnancy 06/03/2013  . Pregnancy complicated by previous preterm labor in first trimester 01/23/2013  . Hx of preeclampsia, prior pregnancy, currently pregnant 01/23/2013  . Family history of congenital heart defect 01/23/2013  . Obesity (BMI 30-39.9) 01/23/2013    PLAN: Keep on L&D as antenatal status Await final [place] read  Start 24 hour urine this AM Second BMZ tonight at 2300 Has been normotensive.   Likely home tomorrow after 24 hour urine and second BMZ.    Olivia Latus, MD  [facility], [place]

## 2013-08-05 DIAGNOSIS — IMO0002 Reserved for concepts with insufficient information to code with codable children: Secondary | ICD-10-CM

## 2013-08-05 LAB — COMPREHENSIVE METABOLIC PANEL
ALBUMIN: 2.6 g/dL — AB (ref 3.5–5.2)
ALK PHOS: 82 U/L (ref 39–117)
ALT: 11 U/L (ref 0–35)
AST: 14 U/L (ref 0–37)
BUN: 16 mg/dL (ref 6–23)
CHLORIDE: 102 meq/L (ref 96–112)
CO2: 19 meq/L (ref 19–32)
CREATININE: 0.67 mg/dL (ref 0.50–1.10)
Calcium: 8.9 mg/dL (ref 8.4–10.5)
GLUCOSE: 78 mg/dL (ref 70–99)
POTASSIUM: 4.1 meq/L (ref 3.7–5.3)
Sodium: 137 mEq/L (ref 137–147)
Total Protein: 6.4 g/dL (ref 6.0–8.3)

## 2013-08-05 LAB — PROTEIN, URINE, 24 HOUR
Collection Interval-UPROT: 24 hours
Protein, 24H Urine: 378 mg/d — ABNORMAL HIGH (ref 50–100)
Protein, Urine: 18 mg/dL
Urine Total Volume-UPROT: 2100 mL

## 2013-08-05 LAB — CBC
HEMATOCRIT: 29.8 % — AB (ref 36.0–46.0)
Hemoglobin: 10 g/dL — ABNORMAL LOW (ref 12.0–15.0)
MCH: 29.9 pg (ref 26.0–34.0)
MCHC: 33.6 g/dL (ref 30.0–36.0)
MCV: 89 fL (ref 78.0–100.0)
Platelets: 190 10*3/uL (ref 150–400)
RBC: 3.35 MIL/uL — AB (ref 3.87–5.11)
RDW: 15.2 % (ref 11.5–15.5)
WBC: 12.5 10*3/uL — ABNORMAL HIGH (ref 4.0–10.5)

## 2013-08-05 LAB — GLUCOSE, CAPILLARY
GLUCOSE-CAPILLARY: 135 mg/dL — AB (ref 70–99)
Glucose-Capillary: 158 mg/dL — ABNORMAL HIGH (ref 70–99)
Glucose-Capillary: 100 mg/dL — ABNORMAL HIGH (ref 70–99)

## 2013-08-05 LAB — CREATININE CLEARANCE, URINE, 24 HOUR
COLLECTION INTERVAL-CRCL: 24 h
CREATININE 24H UR: 1737 mg/d (ref 700–1800)
Creatinine Clearance: 188 mL/min — ABNORMAL HIGH (ref 75–115)
Creatinine, Urine: 82.71 mg/dL
Creatinine: 0.64 mg/dL (ref 0.50–1.10)
Urine Total Volume-CRCL: 2100 mL

## 2013-08-05 MED ORDER — BUTALBITAL-APAP-CAFFEINE 50-325-40 MG PO TABS: 1.0000 | ORAL_TABLET | ORAL | Status: DC | PRN

## 2013-08-05 MED ORDER — BUTALBITAL-APAP-CAFFEINE 50-325-40 MG PO TABS
1.0000 | ORAL_TABLET | ORAL | Status: DC | PRN
  Administered 2013-08-05: 1 via ORAL
  Filled 2013-08-05: qty 1

## 2013-08-05 MED ORDER — GLYBURIDE 2.5 MG PO TABS: 2.5000 mg | ORAL_TABLET | Freq: Two times a day (BID) | ORAL | Status: DC

## 2013-08-05 MED ORDER — ALUM & MAG HYDROXIDE-SIMETH 200-200-20 MG/5ML PO SUSP
30.0000 mL | ORAL | Status: DC | PRN
  Administered 2013-08-05: 30 mL via ORAL
  Filled 2013-08-05: qty 30

## 2013-08-05 NOTE — Progress Notes (Signed)
Patient ID: Olivia Moon, female   DOB: 1981/01/28, 33 y.o.   MRN: [id number] [place]) NOTE  Olivia Moon is a 33 y.o. [id number] at [redacted]w[redacted]d  who is admitted for evaluation for preeclampsia.   Length of Stay:  2  Days  Subjective: Pt has a headache not relieved by Tylenol this morning. Patient reports the fetal movement as active. Patient reports uterine contraction  activity as none. Patient reports  vaginal bleeding as none. Patient describes fluid per vagina as None.  Vitals:  Blood pressure 112/64, pulse 88, temperature 97.8 F (36.6 C), temperature source Oral, resp. rate 18, height 5' 3.5" (1.613 m), weight 247 lb 4 oz (112.152 kg), last menstrual period 11/25/2012. Physical Examination:  General appearance - alert, well appearing, and in no distress Abdomen - soft, nontender, gravid Extremities - neg Homans, no erythema  Fetal Monitoring:  Baseline: 120 bpm, moderate variability, no decels, + accels  Labs:  Results for orders placed during the hospital encounter of 08/03/13 (from the past 24 hour(s))  GLUCOSE, CAPILLARY   Collection Time    08/04/13  8:02 AM      Result Value Ref Range   Glucose-Capillary 134 (*) 70 - 99 mg/dL  GLUCOSE, CAPILLARY   Collection Time    08/04/13 10:45 AM      Result Value Ref Range   Glucose-Capillary 141 (*) 70 - 99 mg/dL  GLUCOSE, CAPILLARY   Collection Time    08/04/13  1:48 PM      Result Value Ref Range   Glucose-Capillary 119 (*) 70 - 99 mg/dL  GLUCOSE, CAPILLARY   Collection Time    08/04/13  7:10 PM      Result Value Ref Range   Glucose-Capillary 145 (*) 70 - 99 mg/dL   Comment 1 Notify RN    GLUCOSE, CAPILLARY   Collection Time    08/05/13  6:06 AM      Result Value Ref Range   Glucose-Capillary 135 (*) 70 - 99 mg/dL     Medications:  Scheduled . docusate sodium  100 mg Oral Daily  . glyBURIDE  2.5 mg Oral BID  . prenatal multivitamin  1 tablet Oral Q1200   I have reviewed the  patient's current medications.  ASSESSMENT: Patient Active Problem List   Diagnosis Date Noted  . Preeclampsia 08/03/2013  . Breech presentation, antepartum 07/22/2013  . Gestational diabetes mellitus 06/27/2013  . Supervision of normal pregnancy 06/03/2013  . Pregnancy complicated by previous preterm labor in first trimester 01/23/2013  . Hx of preeclampsia, prior pregnancy, currently pregnant 01/23/2013  . Family history of congenital heart defect 01/23/2013  . Obesity (BMI 30-39.9) 01/23/2013    PLAN: Olivia Moon is a 33 y.o. [id number] at [redacted]w[redacted]d  who is admitted for evaluation for preeclampsia.  1-Fasting CBG 132; Continue glyburide; consider increasing dose to 5 mg tonight. 2-Fiorcet for headache. 3-Continue 24 hour urine 4-Reassess at lunch for headache and disposition.  Olivia Moon 08/05/2013,7:08 AM

## 2013-08-05 NOTE — Discharge Summary (Signed)
Physician Discharge Summary  Patient ID: Olivia Moon MRN: [id number] DOB/AGE: Apr 01, 1981 33 y.o.  Admit date: 08/03/2013 Discharge date: 08/05/2013  Admission Diagnoses:[redacted] weeks gestation, preeclampsia  Discharge Diagnoses: same Active Problems:   Preeclampsia   Discharged Condition: fair  Hospital Course: [id number] Patient's last menstrual period was 11/25/2012. [redacted]w[redacted]d Admitted for evaluation for preeclampsia, possibly severe. She had occasional elevated BP and mild headache relieved by Fioricet. Pr/Cr was 0.32 and 24 hr urine 378 mg  Consults: None  Significant Diagnostic Studies: labs: Pr/Cr. 24 hr urine  Treatments: steroids: betamethasone x 2  Discharge Exam: Blood pressure 128/70, pulse 88, temperature 97.8 F (36.6 C), temperature source Oral, resp. rate 19, height 5' 3.5" (1.613 m), weight 247 lb 4 oz (112.152 kg), last menstrual period 11/25/2012, SpO2 100.00%. General appearance: alert, cooperative and no distress  Disposition:    Future Appointments Provider Department Dept Phone   08/08/2013 1:30 PM Osborne Oman, MD [facility] [place]       Medication List         butalbital-acetaminophen-caffeine 50-325-40 MG per tablet  Commonly known as:  FIORICET, ESGIC  Take 1 tablet by mouth every 4 (four) hours as needed for headache.     glyBURIDE 2.5 MG tablet  Commonly known as:  DIABETA  Take 1 tablet (2.5 mg total) by mouth 2 (two) times daily.     prenatal multivitamin Tabs tablet  Take 1 tablet by mouth daily at 12 noon.           Follow-up Information   Follow up with [facility] [place] In 2 days.   Specialty:  Obstetrics and Gynecology   Contact information:   [address] [place], [place] [address] [phone]    Mild preeclampsia, for OP f/u in 2 days  Signed: Woodroe Mode 08/05/2013, 5:56 PM

## 2013-08-05 NOTE — Discharge Instructions (Signed)
Preeclampsia and Eclampsia °Preeclampsia is a condition of high blood pressure during pregnancy. It can happen at 20 weeks or later in pregnancy. If high blood pressure occurs in the second half of pregnancy with no other symptoms, it is called gestational hypertension and goes away after the baby is born. If any of the symptoms listed below develop with gestational hypertension, it is then called preeclampsia. Eclampsia (convulsions) may follow preeclampsia. This is one of the reasons for regular prenatal checkups. Early diagnosis and treatment are very important to prevent eclampsia. °CAUSES  °There is no known cause of preeclampsia/eclampsia in pregnancy. There are several known conditions that may put the pregnant woman at risk, such as: °· The first pregnancy. °· Having preeclampsia in a past pregnancy. °· Having lasting (chronic) high blood pressure. °· Having multiples (twins, triplets). °· Being age 35 or older. °· African American ethnic background. °· Having kidney disease or diabetes. °· Medical conditions such as lupus or blood diseases. °· Being overweight (obese). °SYMPTOMS  °· High blood pressure. °· Headaches. °· Sudden weight gain. °· Swelling of hands, face, legs, and feet. °· Protein in the urine. °· Feeling sick to your stomach (nauseous) and throwing up (vomiting). °· Vision problems (blurred or double vision). °· Numbness in the face, arms, legs, and feet. °· Dizziness. °· Slurred speech. °· Preeclampsia can cause growth retardation in the fetus. °· Separation (abruption) of the placenta. °· Not enough fluid in the amniotic sac (oligohydramnios). °· Sensitivity to bright lights. °· Belly (abdominal) pain. °DIAGNOSIS  °If protein is found in the urine in the second half of pregnancy, this is considered preeclampsia. Other symptoms mentioned above may also be present. °TREATMENT  °It is necessary to treat this. °· Your caregiver may prescribe bed rest early in this condition. Plenty of rest and  salt restriction may be all that is needed. °· Medicines may be necessary to lower blood pressure if the condition does not respond to more conservative measures. °· In more severe cases, hospitalization may be needed: °· For treatment of blood pressure. °· To control fluid retention. °· To monitor the baby to see if the condition is causing harm to the baby. °· Hospitalization is the best way to treat the first sign of preeclampsia. This is so the mother and baby can be watched closely and blood tests can be done effectively and correctly. °· If the condition becomes severe, it may be necessary to induce labor or to remove the infant by surgical means (cesarean section). The best cure for preeclampsia/eclampsia is to deliver the baby. °Preeclampsia and eclampsia involve risks to mother and infant. Your caregiver will discuss these risks with you. Together, you can work out the best possible approach to your problems. Make sure you keep your prenatal visits as scheduled. Not keeping appointments could result in a chronic or permanent injury, pain, disability to you, and death or injury to you or your unborn baby. If there is any problem keeping the appointment, you must call to reschedule. °HOME CARE INSTRUCTIONS  °· Keep your prenatal appointments and tests as scheduled. °· Tell your caregiver if you have any of the above risk factors. °· Get plenty of rest and sleep. °· Eat a balanced diet that is low in salt, and do not add salt to your food. °· Avoid stressful situations. °· Only take over-the-counter and prescriptions medicines for pain, discomfort, or fever as directed by your caregiver. °SEEK IMMEDIATE MEDICAL CARE IF:  °· You develop severe swelling   anywhere in the body. This usually occurs in the legs. °· You gain 05 lb/2.3 kg or more in a week. °· You develop a severe headache, dizziness, problems with your vision, or confusion. °· You have abdominal pain, nausea, or vomiting. °· You have a seizure. °· You  have trouble moving any part of your body, or you develop numbness or problems speaking. °· You have bruising or abnormal bleeding from anywhere in the body. °· You develop a stiff neck. °· You pass out. °MAKE SURE YOU:  °· Understand these instructions. °· Will watch your condition. °· Will get help right away if you are not doing well or get worse. °Document Released: 03/18/2000 Document Revised: 06/13/2011 Document Reviewed: 11/02/2007 °ExitCare® Patient Information ©2014 [employer]. ° °

## 2013-08-05 NOTE — Progress Notes (Signed)
Patient going on wheelchair ride.

## 2013-08-06 ENCOUNTER — Telehealth: Payer: Self-pay | Admitting: *Deleted

## 2013-08-06 ENCOUNTER — Inpatient Hospital Stay (HOSPITAL_COMMUNITY)
Admission: AD | Admit: 2013-08-06 | Discharge: 2013-08-06 | Disposition: A | Discharge status: Home / Self Care | Payer: 59 | Source: Ambulatory Visit | Attending: Obstetrics & Gynecology | Admitting: Obstetrics & Gynecology

## 2013-08-06 ENCOUNTER — Encounter (HOSPITAL_COMMUNITY): Payer: Self-pay | Admitting: *Deleted

## 2013-08-06 DIAGNOSIS — R1013 Epigastric pain: Secondary | ICD-10-CM | POA: Insufficient documentation

## 2013-08-06 DIAGNOSIS — R079 Chest pain, unspecified: Secondary | ICD-10-CM | POA: Insufficient documentation

## 2013-08-06 DIAGNOSIS — O99891 Other specified diseases and conditions complicating pregnancy: Secondary | ICD-10-CM | POA: Insufficient documentation

## 2013-08-06 DIAGNOSIS — R1011 Right upper quadrant pain: Secondary | ICD-10-CM | POA: Insufficient documentation

## 2013-08-06 DIAGNOSIS — O4700 False labor before 37 completed weeks of gestation, unspecified trimester: Secondary | ICD-10-CM | POA: Insufficient documentation

## 2013-08-06 DIAGNOSIS — O26899 Other specified pregnancy related conditions, unspecified trimester: Secondary | ICD-10-CM

## 2013-08-06 DIAGNOSIS — R0602 Shortness of breath: Secondary | ICD-10-CM | POA: Insufficient documentation

## 2013-08-06 DIAGNOSIS — O9989 Other specified diseases and conditions complicating pregnancy, childbirth and the puerperium: Secondary | ICD-10-CM

## 2013-08-06 DIAGNOSIS — K219 Gastro-esophageal reflux disease without esophagitis: Secondary | ICD-10-CM

## 2013-08-06 DIAGNOSIS — R51 Headache: Secondary | ICD-10-CM

## 2013-08-06 LAB — URINALYSIS, ROUTINE W REFLEX MICROSCOPIC
Bilirubin Urine: NEGATIVE
Glucose, UA: NEGATIVE mg/dL
Ketones, ur: NEGATIVE mg/dL
Leukocytes, UA: NEGATIVE
NITRITE: NEGATIVE
PROTEIN: NEGATIVE mg/dL
Specific Gravity, Urine: >1.03 — ABNORMAL HIGH (ref 1.005–1.030)
Urobilinogen, UA: 0.2 mg/dL (ref 0.0–1.0)
pH: 6 (ref 5.0–8.0)

## 2013-08-06 LAB — COMPREHENSIVE METABOLIC PANEL
ALT: 15 U/L (ref 0–35)
AST: 22 U/L (ref 0–37)
Albumin: 2.3 g/dL — ABNORMAL LOW (ref 3.5–5.2)
Alkaline Phosphatase: 79 U/L (ref 39–117)
BUN: 14 mg/dL (ref 6–23)
CALCIUM: 8.7 mg/dL (ref 8.4–10.5)
CO2: 20 meq/L (ref 19–32)
Chloride: 102 mEq/L (ref 96–112)
Creatinine, Ser: 0.65 mg/dL (ref 0.50–1.10)
Glucose, Bld: 98 mg/dL (ref 70–99)
Potassium: 4.2 mEq/L (ref 3.7–5.3)
Sodium: 138 mEq/L (ref 137–147)
Total Protein: 5.8 g/dL — ABNORMAL LOW (ref 6.0–8.3)

## 2013-08-06 LAB — CBC
HEMATOCRIT: 30.2 % — AB (ref 36.0–46.0)
HEMOGLOBIN: 10.1 g/dL — AB (ref 12.0–15.0)
MCH: 30.1 pg (ref 26.0–34.0)
MCHC: 33.4 g/dL (ref 30.0–36.0)
MCV: 89.9 fL (ref 78.0–100.0)
PLATELETS: 186 10*3/uL (ref 150–400)
RBC: 3.36 MIL/uL — ABNORMAL LOW (ref 3.87–5.11)
RDW: 15.4 % (ref 11.5–15.5)
WBC: 10.9 10*3/uL — AB (ref 4.0–10.5)

## 2013-08-06 LAB — URINE MICROSCOPIC-ADD ON

## 2013-08-06 LAB — PROTEIN / CREATININE RATIO, URINE
Creatinine, Urine: 152.82 mg/dL
Protein Creatinine Ratio: 0.26 — ABNORMAL HIGH (ref 0.00–0.15)
Total Protein, Urine: 40 mg/dL

## 2013-08-06 MED ORDER — BUTALBITAL-APAP-CAFFEINE 50-325-40 MG PO TABS
2.0000 | ORAL_TABLET | ORAL | Status: AC
  Administered 2013-08-06: 2 via ORAL
  Filled 2013-08-06: qty 2

## 2013-08-06 MED ORDER — RANITIDINE HCL 150 MG PO TABS: 150.0000 mg | ORAL_TABLET | Freq: Two times a day (BID) | ORAL | Status: DC

## 2013-08-06 MED ORDER — GI COCKTAIL ~~LOC~~
30.0000 mL | Freq: Once | ORAL | Status: AC
  Administered 2013-08-06: 30 mL via ORAL
  Filled 2013-08-06: qty 30

## 2013-08-06 NOTE — MAU Provider Note (Signed)
Chief Complaint:  Abdominal Pain  @MAUPATCONTACT @  HPI: Olivia Moon is a 33 y.o. [id number] at [redacted]w[redacted]d who presents to maternity admissions reporting chest discomfort, SOB, and HA.  Patient reports prior to discharge on 08/05/13 she additionally complained of some chest / epigastric pain, without significant relief with Mylanta. Today complains of worsening SOB and chest pain today from yesterday, tried Tylenol today, laying flat and walking long distances / stairs makes symptoms worse. Denies hx of GERD or taking any meds to treat this complaint earlier in pregnancy. Additionally, complains of persistent dull ache HA (frontal / top of head), 4/10, Tylenol some relief down to 2/10, has not taken Fioricet (has not taken at home yet, because it did not make her feel good while in hospital).  Admits burning sensation in throat, epigastric tenderness. Denies any vision changes or dizziness. Denies contractions, leakage of fluid or vaginal bleeding. Good fetal movement.   Recent course: Admitted to antenatal for observation following dx Pre-eclampsia (admit 08/03/13, discharged 08/05/13), found to have elevated non-severe range BP, +HA (relieved with Fioricet), elevated UPC 0.32, 24hr urine protein elevated at 378mg . Completed course of BMZ x 2 doses.  Pregnancy Course: PNC at [facility] (High Risk), dating by LMP/US. Pregnancy complicated by GDM(A1) on glyburide, recent dx with Pre-eclampsia (non-severe range BP), also obesity. - Of note last [place] (@ 31.3 wks) - Pilar Plate Breech position (EFW 4 lb 7 oz, 2008g, 76%tile, AFI 8.7)  - Significant hx 1st pregnancy (10 yrs ago), Severe Pre-eclampsia, IOL for preeclampsia at 31 weeks with SVD   Past Medical History: Past Medical History  Diagnosis Date  . Gestational diabetes mellitus, antepartum   . Preeclampsia     "with first pregnancy"  . Gestational diabetes     Past obstetric history: OB History  Gravida Para Term Preterm AB SAB TAB Ectopic Multiple  Living  4 2 1 1 1 1    2     # Outcome Date GA Lbr Len/2nd Weight Sex Delivery Anes PTL Lv  4 CUR           3 SAB 2009 [redacted]w[redacted]d         2 TRM 2005 [redacted]w[redacted]d  3.402 kg (7 lb 8 oz)  SVD EPI  Y  1 PRE 2004 [redacted]w[redacted]d  2.155 kg (4 lb 12 oz) F SVD   Y      Past Surgical History: Past Surgical History  Procedure Laterality Date  . None    . No past surgeries      Family History: Family History  Problem Relation Age of Onset  . Diabetes Mother   . Breast cancer Maternal Grandmother   . Hypertension Mother   . Breast cancer Maternal Aunt   . Hypertension Maternal Grandmother   . Hypertension Sister   . Diabetes Sister   . Heart disease Father   . Rheumatic fever Father   . Heart disease Paternal Grandfather     Social History: History  Substance Use Topics  . Smoking status: Never Smoker   . Smokeless tobacco: Never Used  . Alcohol Use: No     Comment: before pregnancy    Allergies:  Allergies  Allergen Reactions  . Aspirin Anaphylaxis    Meds:  No prescriptions prior to admission    ROS: Pertinent findings in history of present illness.  Physical Exam  Blood pressure 119/69, pulse 78, temperature 98.2 F (36.8 C), temperature source Oral, resp. rate 16, last menstrual period 11/25/2012, SpO2 97.00%. Patient Vitals for the  past 24 hrs:  BP Temp Temp src Pulse Resp SpO2  08/06/13 2318 119/69 mmHg - - 78 16 -  08/06/13 2150 - - - 82 - 97 %  08/06/13 2120 - - - 81 - 98 %  08/06/13 2050 - - - 82 - 98 %  08/06/13 2028 - - - 88 - 98 %  08/06/13 2020 - - - 85 - 98 %  08/06/13 1957 115/60 mmHg - - 87 - -  08/06/13 1950 - - - 83 - 99 %  08/06/13 1920 - - - 86 - 98 %  08/06/13 1850 - - - 95 - 98 %  08/06/13 1846 130/66 mmHg - - 92 - -  08/06/13 1833 137/83 mmHg 98.2 F (36.8 C) Oral 88 20 99 %   GENERAL: Well-appearing, cooperative, some difficulty with prolonged speech d/t SOB, NAD HEENT: NCAT, MMM HEART: RRR, no murmurs RESP: CTAB, good air movement, normal  effort ABDOMEN: Soft, +epigastric and RUQ/LUQ abdominal tenderness to palpation, gravid appropriate for gestational age EXTREMITIES: bilateral +1 pitting edema calves / ankles, no erythema or calf tenderness, mild left anterior ankle pain NEURO: alert and oriented    FHT:  Baseline 145bpm , moderate variability, accelerations present, no decelerations Contractions: irregular q 10-15 min   Labs: Results for orders placed during the hospital encounter of 08/06/13 (from the past 24 hour(s))  CBC     Status: Abnormal   Collection Time    08/06/13  8:07 PM      Result Value Ref Range   WBC 10.9 (*) 4.0 - 10.5 K/uL   RBC 3.36 (*) 3.87 - 5.11 MIL/uL   Hemoglobin 10.1 (*) 12.0 - 15.0 g/dL   HCT 30.2 (*) 36.0 - 46.0 %   MCV 89.9  78.0 - 100.0 fL   MCH 30.1  26.0 - 34.0 pg   MCHC 33.4  30.0 - 36.0 g/dL   RDW 15.4  11.5 - 15.5 %   Platelets 186  150 - 400 K/uL  COMPREHENSIVE METABOLIC PANEL     Status: Abnormal   Collection Time    08/06/13  8:07 PM      Result Value Ref Range   Sodium 138  137 - 147 mEq/L   Potassium 4.2  3.7 - 5.3 mEq/L   Chloride 102  96 - 112 mEq/L   CO2 20  19 - 32 mEq/L   Glucose, Bld 98  70 - 99 mg/dL   BUN 14  6 - 23 mg/dL   Creatinine, Ser 0.65  0.50 - 1.10 mg/dL   Calcium 8.7  8.4 - 10.5 mg/dL   Total Protein 5.8 (*) 6.0 - 8.3 g/dL   Albumin 2.3 (*) 3.5 - 5.2 g/dL   AST 22  0 - 37 U/L   ALT 15  0 - 35 U/L   Alkaline Phosphatase 79  39 - 117 U/L   Total Bilirubin <0.2 (*) 0.3 - 1.2 mg/dL   GFR calc non Af Amer >90  >90 mL/min   GFR calc Af Amer >90  >90 mL/min  URINALYSIS, ROUTINE W REFLEX MICROSCOPIC     Status: Abnormal   Collection Time    08/06/13 10:20 PM      Result Value Ref Range   Color, Urine YELLOW  YELLOW   APPearance CLEAR  CLEAR   Specific Gravity, Urine >1.030 (*) 1.005 - 1.030   pH 6.0  5.0 - 8.0   Glucose, UA NEGATIVE  NEGATIVE mg/dL   Hgb  urine dipstick SMALL (*) NEGATIVE   Bilirubin Urine NEGATIVE  NEGATIVE   Ketones, ur  NEGATIVE  NEGATIVE mg/dL   Protein, ur NEGATIVE  NEGATIVE mg/dL   Urobilinogen, UA 0.2  0.0 - 1.0 mg/dL   Nitrite NEGATIVE  NEGATIVE   Leukocytes, UA NEGATIVE  NEGATIVE  PROTEIN / CREATININE RATIO, URINE     Status: Abnormal   Collection Time    08/06/13 10:20 PM      Result Value Ref Range   Creatinine, Urine 152.82     Total Protein, Urine 40     PROTEIN CREATININE RATIO 0.26 (*) 0.00 - 0.15  URINE MICROSCOPIC-ADD ON     Status: None   Collection Time    08/06/13 10:20 PM      Result Value Ref Range   Squamous Epithelial / LPF RARE  RARE   WBC, UA 0-2  <3 WBC/hpf   RBC / HPF 0-2  <3 RBC/hpf   Bacteria, UA RARE  RARE    Assessment: 1. Headache in pregnancy   2. Acid reflux     Olivia Moon is a 33 y.o. [id number] at [redacted]w[redacted]d who presented to MAU with lower chest / epigastric discomfort and shortness of breath worsening since yesterday. Recently hospitalized 08/03/13 to 08/05/13 ([place] yesterday evening), newly dx Pre-Eclampsia (nml to mild range BPs). History and exam suggestive of suspected GERD in pregnancy, ordered GI cocktail to see if improvement.  Given dx Pre-eclampsia, will re-check recent labs (CMET, CBC) to ensure no progression given concerning RUQ pain and persistent HA (reassuring with some response to Tylenol), also reassuring with stable BPs (non-severe range).  UPDATE: 2030 Additionally ordered UA and UPC, to monitor for any increasing proteinuria. Continue to cycle BPs to monitor trend. Case discussed with Fatima Blank, CNM on shift change, to assume care of patient.  Plan: GI Cocktail with complete relief of epigastric pain Fioricet x2 tabs in MAU with relief of h/a symptoms Consult Dr Ihor Dow D/C home with preeclampsia precautions [facility] scheduled [facility] Return to MAU as needed      Follow-up Information   Follow up with [facility] [place]. (Return to MAU as needed for emergencies.)    Specialty:  Obstetrics and  Gynecology   Contact information:   [place], [address] [place] [address] [phone]    - UA, UPC  - CMET, CBC     Medication List         acetaminophen 500 MG tablet  Commonly known as:  TYLENOL  Take 500 mg by mouth every 6 (six) hours as needed.     glyBURIDE 2.5 MG tablet  Commonly known as:  DIABETA  Take 1 tablet (2.5 mg total) by mouth 2 (two) times daily.     prenatal multivitamin Tabs tablet  Take 1 tablet by mouth daily at 12 noon.     ranitidine 150 MG tablet  Commonly known as:  ZANTAC  Take 1 tablet (150 mg total) by mouth 2 (two) times daily.        Nobie Putnam, [place], PGY-1 08/07/2013 5:29 AM

## 2013-08-06 NOTE — Telephone Encounter (Signed)
Pt's husband called stating that she was SOB and very antsy and can't sit still.  Pt has diagnosis of pre-eclampsia.  Instructed husband to take patient to MAU now, as this can be a sign of pulmonary edema.  Husband to take her now.

## 2013-08-06 NOTE — MAU Note (Signed)
Pt has been a pt at [facility] For evaluation ob blood pressure. Pt states 24hour urine was collected and 2 steriod shots also given. Pt complaining of difficulty breathing

## 2013-08-06 NOTE — Progress Notes (Signed)
Pt  States she has a headache that she rates a 3

## 2013-08-06 NOTE — Discharge Instructions (Signed)
Hypertension During Pregnancy Hypertension is also called high blood pressure. It can occur at any time in life and during pregnancy. When you have hypertension, there is extra pressure inside your blood vessels that carry blood from the heart to the rest of your body (arteries). Hypertension during pregnancy can cause problems for you and your baby. Your baby might not weigh as much as it should at birth or might be born early (premature). Very bad cases of hypertension during pregnancy can be life threatening.  Different types of hypertension can occur during pregnancy.   Chronic hypertension. This happens when a woman has hypertension before pregnancy and it continues during pregnancy.  Gestational hypertension. This is when hypertension develops during pregnancy.  Preeclampsia or toxemia of pregnancy. This is a very serious type of hypertension that develops only during pregnancy. It is a disease that affects the whole body (systemic) and can be very dangerous for both mother and baby.  Gestational hypertension and preeclampsia usually go away after your baby is born. Blood pressure generally stabilizes within 6 weeks. Women who have hypertension during pregnancy have a greater chance of developing hypertension later in life or with future pregnancies. RISK FACTORS Some factors make you more likely to develop hypertension during pregnancy. Risk factors include:  Having hypertension before pregnancy.  Having hypertension during a previous pregnancy.  Being overweight.  Being older than 57.  Being pregnant with more than one baby (multiples).  Having diabetes or kidney problems. SIGNS AND SYMPTOMS Chronic and gestational hypertension rarely cause symptoms. Preeclampsia has symptoms, which may include:  Increased protein in your urine. Your health care provider will check for this at every prenatal visit.  Swelling of your hands and face.  Rapid weight gain.  Headaches.  Visual  changes.  Being bothered by light.  Abdominal pain, especially in the right upper area.  Chest pain.  Shortness of breath.  Increased reflexes.  Seizures. Seizures occur with a more severe form of preeclampsia, called eclampsia. DIAGNOSIS   You may be diagnosed with hypertension during a regular prenatal exam. At each visit, tests may include:  Blood pressure checks.  A urine test to check for protein in your urine.  The type of hypertension you are diagnosed with depends on when you developed it. It also depends on your specific blood pressure reading.  Developing hypertension before 20 weeks of pregnancy is consistent with chronic hypertension.  Developing hypertension after 20 weeks of pregnancy is consistent with gestational hypertension.  Hypertension with increased urinary protein is diagnosed as preeclampsia.  Blood pressure measurements that stay above 696 systolic or 789 diastolic are a sign of severe preeclampsia. TREATMENT Treatment for hypertension during pregnancy varies. Treatment depends on the type of hypertension and how serious it is.  If you take medicine for chronic hypertension, you may need to switch medicines.  Drugs called ACE inhibitors should not be taken during pregnancy.  Low-dose aspirin may be suggested for women who have risk factors for preeclampsia.  If you have gestational hypertension, you may need to take a blood pressure medicine that is safe during pregnancy. Your health care provider will recommend the appropriate medicine.  If you have severe preeclampsia, you may need to be in the hospital. Health care providers will watch you and the baby very closely. You also may need to take medicine (magnesium sulfate) to prevent seizures and lower blood pressure.  Sometimes an early delivery is needed. This may be the case if the condition worsens. It would  be done to protect you and the baby. The only cure for preeclampsia is delivery. HOME  CARE INSTRUCTIONS  Schedule and keep all of your regular appointments for prenatal care.  Only take over-the-counter or prescription medicines as directed by your health care provider. Tell your health care provider about all medicines you take.  Eat as little salt as possible.  Get regular exercise.  Do not drink alcohol.  Do not use tobacco products.  Do not drink products with caffeine.  Lie on your left side when resting. SEEK IMMEDIATE MEDICAL CARE IF:  You have severe abdominal pain.  You have sudden swelling in the hands, ankles, or face.  You gain 4 pounds (1.8 kg) or more in 1 week.  You vomit repeatedly.  You have vaginal bleeding.  You do not feel the baby moving as much.  You have a headache.  You have blurred or double vision.  You have muscle twitching or spasms.  You have shortness of breath.  You have blue fingernails and lips.  You have blood in your urine. MAKE SURE YOU:  Understand these instructions.  Will watch your condition.  Will get help right away if you are not doing well or get worse. Document Released: 12/07/2010 Document Revised: 01/09/2013 Document Reviewed: 10/18/2012 [facility] Patient Information 2014 [place], [place]. Gastroesophageal Reflux, Adult Gastroesophageal reflux disease (GERD) happens when acid from your stomach flows up into the esophagus. When acid comes in contact with the esophagus, the acid causes soreness (inflammation) in the esophagus. Over time, GERD may create small holes (ulcers) in the lining of the esophagus. CAUSES   Increased body weight. This puts pressure on the stomach, making acid rise from the stomach into the esophagus.  Smoking. This increases acid production in the stomach.  Drinking alcohol. This causes decreased pressure in the lower esophageal sphincter (valve or ring of muscle between the esophagus and stomach), allowing acid from the stomach into the esophagus.  Late evening meals and a  full stomach. This increases pressure and acid production in the stomach.  A malformed lower esophageal sphincter.  Pregnancy Sometimes, no cause is found. SYMPTOMS   Burning pain in the lower part of the mid-chest behind the breastbone and in the mid-stomach area. This may occur twice a week or more often.  Trouble swallowing.  Sore throat.  Dry cough.  Asthma-like symptoms including chest tightness, shortness of breath, or wheezing. DIAGNOSIS  Your caregiver may be able to diagnose GERD based on your symptoms. In some cases, X-rays and other tests may be done to check for complications or to check the condition of your stomach and esophagus. TREATMENT  Your caregiver may recommend over-the-counter or prescription medicines to help decrease acid production. Ask your caregiver before starting or adding any new medicines.  HOME CARE INSTRUCTIONS   Change the factors that you can control. Ask your caregiver for guidance concerning weight loss, quitting smoking, and alcohol consumption.  Avoid foods and drinks that make your symptoms worse, such as:  Caffeine or alcoholic drinks.  Chocolate.  Peppermint or mint flavorings.  Garlic and onions.  Spicy foods.  Citrus fruits, such as oranges, lemons, or limes.  Tomato-based foods such as sauce, chili, salsa, and pizza.  Fried and fatty foods.  Avoid lying down for the 3 hours prior to your bedtime or prior to taking a nap.  Eat small, frequent meals instead of large meals.  Wear loose-fitting clothing. Do not wear anything tight around your waist that causes pressure  on your stomach.  Raise the head of your bed 6 to 8 inches with wood blocks to help you sleep. Extra pillows will not help.  Only take over-the-counter or prescription medicines for pain, discomfort, or fever as directed by your caregiver.  Do not take aspirin, ibuprofen, or other nonsteroidal anti-inflammatory drugs (NSAIDs). SEEK IMMEDIATE MEDICAL CARE  IF:   You have pain in your arms, neck, jaw, teeth, or back.  Your pain increases or changes in intensity or duration.  You develop nausea, vomiting, or sweating (diaphoresis).  You develop shortness of breath, or you faint.  Your vomit is green, yellow, black, or looks like coffee grounds or blood.  Your stool is red, bloody, or black. These symptoms could be signs of other problems, such as heart disease, gastric bleeding, or esophageal bleeding. MAKE SURE YOU:   Understand these instructions.  Will watch your condition.  Will get help right away if you are not doing well or get worse. Document Released: 12/29/2004 Document Revised: 06/13/2011 Document Reviewed: 10/08/2010 [facility] Patient Information 2014 [place], [place].

## 2013-08-06 NOTE — Progress Notes (Signed)
Pt states it hurts worst when she is trying to take a deep breath

## 2013-08-07 NOTE — MAU Provider Note (Signed)
Attestation of Attending Supervision of Advanced Practitioner (CNM/NP): Evaluation and management procedures were performed by the Advanced Practitioner under my supervision and collaboration.  I have reviewed the Advanced Practitioner's note and chart, and I agree with the management and plan.  Lavonia Drafts 7:32 AM

## 2013-08-08 ENCOUNTER — Encounter: Payer: Self-pay | Admitting: Obstetrics & Gynecology

## 2013-08-08 ENCOUNTER — Ambulatory Visit (INDEPENDENT_AMBULATORY_CARE_PROVIDER_SITE_OTHER): Payer: 59 | Admitting: Obstetrics & Gynecology

## 2013-08-08 VITALS — BP 136/84 | HR 90 | Wt 252.0 lb

## 2013-08-08 DIAGNOSIS — IMO0002 Reserved for concepts with insufficient information to code with codable children: Secondary | ICD-10-CM

## 2013-08-08 DIAGNOSIS — O99810 Abnormal glucose complicating pregnancy: Secondary | ICD-10-CM

## 2013-08-08 DIAGNOSIS — O1400 Mild to moderate pre-eclampsia, unspecified trimester: Secondary | ICD-10-CM

## 2013-08-08 DIAGNOSIS — O24419 Gestational diabetes mellitus in pregnancy, unspecified control: Secondary | ICD-10-CM

## 2013-08-08 DIAGNOSIS — Z3480 Encounter for supervision of other normal pregnancy, unspecified trimester: Secondary | ICD-10-CM

## 2013-08-08 NOTE — Progress Notes (Signed)
Patient has 24 hour inpatient observation secondary to preeclampsia. She had occasional elevated BP and mild headache relieved by Fioricet. Pr/Cr was 0.32 and 24 hr urine 378 mg on 08/04/13.  No persistent severe features were notes, and she was discharged to home for outpatient management. Today, she reports headaches that are alleviated by Fioricet, denies any other severe symptoms. NST performed today was reviewed and was found to be reactive.  AFI was 10.44 cm on 08/03/13.  Will obtain another growth scan next week, last scan was on 07/18/13.  Continue recommended antenatal testing and prenatal care.   As for her GDM, BS are within range.  Has not needed to take Glyburide; this was prescribed in case of steroid-induced hyperglycemia (received BMZ in the hospital). No other complaints or concerns.  Severe preeclampsia, fetal movement and labor precautions reviewed.

## 2013-08-08 NOTE — Patient Instructions (Signed)
Return to clinic for any obstetric concerns or go to MAU for evaluation  

## 2013-08-10 ENCOUNTER — Encounter (HOSPITAL_COMMUNITY): Payer: Self-pay | Admitting: *Deleted

## 2013-08-10 ENCOUNTER — Inpatient Hospital Stay (HOSPITAL_COMMUNITY)
Admission: AD | Admit: 2013-08-10 | Discharge: 2013-08-10 | Disposition: A | Discharge status: Home / Self Care | Payer: 59 | Source: Ambulatory Visit | Attending: Family Medicine | Admitting: Family Medicine

## 2013-08-10 DIAGNOSIS — O09299 Supervision of pregnancy with other poor reproductive or obstetric history, unspecified trimester: Secondary | ICD-10-CM

## 2013-08-10 DIAGNOSIS — O99810 Abnormal glucose complicating pregnancy: Secondary | ICD-10-CM | POA: Insufficient documentation

## 2013-08-10 DIAGNOSIS — IMO0002 Reserved for concepts with insufficient information to code with codable children: Secondary | ICD-10-CM | POA: Insufficient documentation

## 2013-08-10 DIAGNOSIS — O09211 Supervision of pregnancy with history of pre-term labor, first trimester: Secondary | ICD-10-CM

## 2013-08-10 DIAGNOSIS — E669 Obesity, unspecified: Secondary | ICD-10-CM

## 2013-08-10 DIAGNOSIS — Z8279 Family history of other congenital malformations, deformations and chromosomal abnormalities: Secondary | ICD-10-CM

## 2013-08-10 DIAGNOSIS — O0990 Supervision of high risk pregnancy, unspecified, unspecified trimester: Secondary | ICD-10-CM

## 2013-08-10 DIAGNOSIS — R51 Headache: Secondary | ICD-10-CM | POA: Insufficient documentation

## 2013-08-10 DIAGNOSIS — O1400 Mild to moderate pre-eclampsia, unspecified trimester: Secondary | ICD-10-CM

## 2013-08-10 DIAGNOSIS — O24419 Gestational diabetes mellitus in pregnancy, unspecified control: Secondary | ICD-10-CM

## 2013-08-10 DIAGNOSIS — G4440 Drug-induced headache, not elsewhere classified, not intractable: Secondary | ICD-10-CM

## 2013-08-10 DIAGNOSIS — O99891 Other specified diseases and conditions complicating pregnancy: Secondary | ICD-10-CM | POA: Insufficient documentation

## 2013-08-10 DIAGNOSIS — O321XX0 Maternal care for breech presentation, not applicable or unspecified: Secondary | ICD-10-CM | POA: Insufficient documentation

## 2013-08-10 DIAGNOSIS — O9989 Other specified diseases and conditions complicating pregnancy, childbirth and the puerperium: Principal | ICD-10-CM

## 2013-08-10 LAB — URINALYSIS, ROUTINE W REFLEX MICROSCOPIC
Bilirubin Urine: NEGATIVE
Glucose, UA: 100 mg/dL — AB
Ketones, ur: NEGATIVE mg/dL
Leukocytes, UA: NEGATIVE
Nitrite: NEGATIVE
PROTEIN: NEGATIVE mg/dL
SPECIFIC GRAVITY, URINE: 1.015 (ref 1.005–1.030)
UROBILINOGEN UA: 0.2 mg/dL (ref 0.0–1.0)
pH: 6.5 (ref 5.0–8.0)

## 2013-08-10 LAB — URINE MICROSCOPIC-ADD ON: RBC / HPF: NONE SEEN RBC/hpf (ref <3)

## 2013-08-10 MED ORDER — CYCLOBENZAPRINE HCL 10 MG PO TABS: 10.0000 mg | ORAL_TABLET | Freq: Three times a day (TID) | ORAL | Status: DC | PRN

## 2013-08-10 NOTE — MAU Provider Note (Signed)
Chief Complaint:  Headache and Edema   Olivia Moon is a 33 y.o.  [id number] with IUP at [redacted]w[redacted]d presenting for Headache and Edema  Pt was admitted from 5/2-5/4 and diagnosed at that time with mild pre-eclampsia based on a 24 hour protein of 378mg  and several elevated BPs.  She has been having persistent HAs for the last 2 weeks and was sent home at that time on fiorecet.  Seen in clinic 2 days ago and was doing well at that time but with persistent HA. For the past 3 days she has been taking fioricet every 4 hours around the clock and feels like her headaches are getting worse.  Some worsening swelling. No vision changes or abdominal pain.  She also has a hx of A1DM and sugars have been well controlled.  States she went to [facility] today and her bp there was 146/96 so she decided to come in.    +FM. No LOF, vb, ctx.  Wondering if baby has flipped as was breech last time but was moving yesterday.         Menstrual History: OB History   Grav Para Term Preterm Abortions TAB SAB Ect Mult Living   4 2 1 1 1  1   2       G1- preterm at [facility] for severe pre-e G2- term at 38 weeks, 7lb8oz G3- SAB  Patient's last menstrual period was 11/25/2012.      Past Medical History  Diagnosis Date  . Gestational diabetes mellitus, antepartum   . Preeclampsia     "with first pregnancy"  . Gestational diabetes     Past Surgical History  Procedure Laterality Date  . None    . No past surgeries      Family History  Problem Relation Age of Onset  . Diabetes Mother   . Breast cancer Maternal Grandmother   . Hypertension Mother   . Breast cancer Maternal Aunt   . Hypertension Maternal Grandmother   . Hypertension Sister   . Diabetes Sister   . Heart disease Father   . Rheumatic fever Father   . Heart disease Paternal Grandfather     History  Substance Use Topics  . Smoking status: Never Smoker   . Smokeless tobacco: Never Used  . Alcohol Use: No     Comment: before pregnancy       Allergies  Allergen Reactions  . Aspirin Anaphylaxis    Prescriptions prior to admission  Medication Sig Dispense Refill  . acetaminophen (TYLENOL) 500 MG tablet Take 1,000 mg by mouth every 6 (six) hours as needed for headache.       . butalbital-acetaminophen-caffeine (FIORICET, ESGIC) 50-325-40 MG per tablet Take 1 tablet by mouth every 4 (four) hours as needed for headache.       . Prenatal Vit-Fe Fumarate-FA (PRENATAL MULTIVITAMIN) TABS tablet Take 1 tablet by mouth daily at 12 noon.      . ranitidine (ZANTAC) 150 MG tablet Take 1 tablet (150 mg total) by mouth 2 (two) times daily.  60 tablet  0  . ACCU-CHEK AVIVA test strip       . ACCU-CHEK SOFTCLIX LANCETS lancets         Review of Systems - Negative except for what is mentioned in HPI.  Physical Exam  Blood pressure 111/76, pulse 91, temperature 98.5 F (36.9 C), temperature source Oral, resp. rate 18, height 5' 3.5" (1.613 m), weight 115.395 kg (254 lb 6.4 oz), last menstrual period  11/25/2012. GENERAL: Well-developed, well-nourished female in no acute distress.  LUNGS: Clear to auscultation bilaterally.  HEART: Regular rate and rhythm. ABDOMEN: Soft, nontender, nondistended, gravid.  EXTREMITIES: Nontender, 2+ edema to knees bilaterally, 2+ distal pulses. NEURO: 2+ DTRs, 1 beat of clonus on left.    FHT:  Baseline rate 130 bpm   Variability moderate  Accelerations present   Decelerations none Contractions: quiet   Labs: Results for orders placed during the hospital encounter of 08/10/13 (from the past 24 hour(s))  URINALYSIS, ROUTINE W REFLEX MICROSCOPIC   Collection Time    08/10/13  5:20 PM      Result Value Ref Range   Color, Urine YELLOW  YELLOW   APPearance CLEAR  CLEAR   Specific Gravity, Urine 1.015  1.005 - 1.030   pH 6.5  5.0 - 8.0   Glucose, UA 100 (*) NEGATIVE mg/dL   Hgb urine dipstick TRACE (*) NEGATIVE   Bilirubin Urine NEGATIVE  NEGATIVE   Ketones, ur NEGATIVE  NEGATIVE mg/dL   Protein,  ur NEGATIVE  NEGATIVE mg/dL   Urobilinogen, UA 0.2  0.0 - 1.0 mg/dL   Nitrite NEGATIVE  NEGATIVE   Leukocytes, UA NEGATIVE  NEGATIVE  URINE MICROSCOPIC-ADD ON   Collection Time    08/10/13  5:20 PM      Result Value Ref Range   Squamous Epithelial / LPF FEW (*) RARE   WBC, UA 0-2  <3 WBC/hpf   RBC / HPF    <3 RBC/hpf   Value: NO FORMED ELEMENTS SEEN ON URINE MICROSCOPIC EXAMINATION   Bacteria, UA FEW (*) RARE    Imaging Studies:  US Ob Limited  08/05/2013   OBSTETRICAL ULTRASOUND: This exam was performed within a [facility]. The OB US report was generated in the AS system, and faxed to the ordering physician.   This report is also available in [employer] and in the [employer]. See AS Obstetric US report.  US Ob Follow Up  07/18/2013   OBSTETRICAL ULTRASOUND: This exam was performed within a [facility]. The OB US report was generated in the AS system, and faxed to the ordering physician.   This report is also available in [employer] and in the [employer]. See AS Obstetric US report.   Assessment: SEVANNAH MADIA is  33 y.o. [id number] at [redacted]w[redacted]d presents with Headache and Edema  Has a hx of mild pre-e diagnosed this pregnancy.  Currently all BP in the MAU are WNL (SBP 110s-130s/60s-80s) and urine dipstick negative for protein.  Given this, no indication to repeat labs at this time.    Headache likely partially related to stress and likely rebound from fioricet.  Pt states that with tylenol it improves to 3/10 from 10/10 but with fioricet nothing changes.    Plan: 1) headache - d/c fioricet - ok to take tylenol - trial of flexeril also for pain  - severe features discussed  2) hx of pre-e - now normal bp and no protein - f/u as scheduled for NST in clinic on Monday - will continue to re-evaluate and should her HA not improve off fioricet, will re-evaluate at that time in the setting of bp and  urine protein.  - return precautions discussed  3) FWB - cat I tracing - Bedside US done and baby still breech    4) f/u on Monday for NST and appt as scheduled.    Case discussed with Dr. Kennon Rounds.  Zayed Griffie L Shaney Deckman 5/9/20157:05 PM

## 2013-08-10 NOTE — MAU Note (Signed)
Pt dx with preeclampsia last week. Has had a headache for the past 2 days c/o increased swelling in her legs and had a b/p 146/96 at home.

## 2013-08-10 NOTE — Discharge Instructions (Signed)
Third Trimester of Pregnancy °The third trimester is from week 29 through week 42, months 7 through 9. The third trimester is a time when the fetus is growing rapidly. At the end of the ninth month, the fetus is about 20 inches in length and weighs 6 10 pounds.  °BODY CHANGES °Your body goes through many changes during pregnancy. The changes vary from woman to woman.  °· Your weight will continue to increase. You can expect to gain 25 35 pounds (11 16 kg) by the end of the pregnancy. °· You may begin to get stretch marks on your hips, abdomen, and breasts. °· You may urinate more often because the fetus is moving lower into your pelvis and pressing on your bladder. °· You may develop or continue to have heartburn as a result of your pregnancy. °· You may develop constipation because certain hormones are causing the muscles that push waste through your intestines to slow down. °· You may develop hemorrhoids or swollen, bulging veins (varicose veins). °· You may have pelvic pain because of the weight gain and pregnancy hormones relaxing your joints between the bones in your pelvis. Back aches may result from over exertion of the muscles supporting your posture. °· Your breasts will continue to grow and be tender. A yellow discharge may leak from your breasts called colostrum. °· Your belly button may stick out. °· You may feel short of breath because of your expanding uterus. °· You may notice the fetus "dropping," or moving lower in your abdomen. °· You may have a bloody mucus discharge. This usually occurs a few days to a week before labor begins. °· Your cervix becomes thin and soft (effaced) near your due date. °WHAT TO EXPECT AT YOUR PRENATAL EXAMS  °You will have prenatal exams every 2 weeks until week 36. Then, you will have weekly prenatal exams. During a routine prenatal visit: °· You will be weighed to make sure you and the fetus are growing normally. °· Your blood pressure is taken. °· Your abdomen will be  measured to track your baby's growth. °· The fetal heartbeat will be listened to. °· Any test results from the previous visit will be discussed. °· You may have a cervical check near your due date to see if you have effaced. °At around 36 weeks, your caregiver will check your cervix. At the same time, your caregiver will also perform a test on the secretions of the vaginal tissue. This test is to determine if a type of bacteria, Group B streptococcus, is present. Your caregiver will explain this further. °Your caregiver may ask you: °· What your birth plan is. °· How you are feeling. °· If you are feeling the baby move. °· If you have had any abnormal symptoms, such as leaking fluid, bleeding, severe headaches, or abdominal cramping. °· If you have any questions. °Other tests or screenings that may be performed during your third trimester include: °· Blood tests that check for low iron levels (anemia). °· Fetal testing to check the health, activity level, and growth of the fetus. Testing is done if you have certain medical conditions or if there are problems during the pregnancy. °FALSE LABOR °You may feel small, irregular contractions that eventually go away. These are called Braxton Hicks contractions, or false labor. Contractions may last for hours, days, or even weeks before true labor sets in. If contractions come at regular intervals, intensify, or become painful, it is best to be seen by your caregiver.  °  SIGNS OF LABOR  °· Menstrual-like cramps. °· Contractions that are 5 minutes apart or less. °· Contractions that start on the top of the uterus and spread down to the lower abdomen and back. °· A sense of increased pelvic pressure or back pain. °· A watery or bloody mucus discharge that comes from the vagina. °If you have any of these signs before the 37th week of pregnancy, call your caregiver right away. You need to go to the hospital to get checked immediately. °HOME CARE INSTRUCTIONS  °· Avoid all  smoking, herbs, alcohol, and unprescribed drugs. These chemicals affect the formation and growth of the baby. °· Follow your caregiver's instructions regarding medicine use. There are medicines that are either safe or unsafe to take during pregnancy. °· Exercise only as directed by your caregiver. Experiencing uterine cramps is a good sign to stop exercising. °· Continue to eat regular, healthy meals. °· Wear a good support bra for breast tenderness. °· Do not use hot tubs, steam rooms, or saunas. °· Wear your seat belt at all times when driving. °· Avoid raw meat, uncooked cheese, cat litter boxes, and soil used by cats. These carry germs that can cause birth defects in the baby. °· Take your prenatal vitamins. °· Try taking a stool softener (if your caregiver approves) if you develop constipation. Eat more high-fiber foods, such as fresh vegetables or fruit and whole grains. Drink plenty of fluids to keep your urine clear or pale yellow. °· Take warm sitz baths to soothe any pain or discomfort caused by hemorrhoids. Use hemorrhoid cream if your caregiver approves. °· If you develop varicose veins, wear support hose. Elevate your feet for 15 minutes, 3 4 times a day. Limit salt in your diet. °· Avoid heavy lifting, wear low heal shoes, and practice good posture. °· Rest a lot with your legs elevated if you have leg cramps or low back pain. °· Visit your dentist if you have not gone during your pregnancy. Use a soft toothbrush to brush your teeth and be gentle when you floss. °· A sexual relationship may be continued unless your caregiver directs you otherwise. °· Do not travel far distances unless it is absolutely necessary and only with the approval of your caregiver. °· Take prenatal classes to understand, practice, and ask questions about the labor and delivery. °· Make a trial run to the hospital. °· Pack your hospital bag. °· Prepare the baby's nursery. °· Continue to go to all your prenatal visits as directed  by your caregiver. °SEEK MEDICAL CARE IF: °· You are unsure if you are in labor or if your water has broken. °· You have dizziness. °· You have mild pelvic cramps, pelvic pressure, or nagging pain in your abdominal area. °· You have persistent nausea, vomiting, or diarrhea. °· You have a bad smelling vaginal discharge. °· You have pain with urination. °SEEK IMMEDIATE MEDICAL CARE IF:  °· You have a fever. °· You are leaking fluid from your vagina. °· You have spotting or bleeding from your vagina. °· You have severe abdominal cramping or pain. °· You have rapid weight loss or gain. °· You have shortness of breath with chest pain. °· You notice sudden or extreme swelling of your face, hands, ankles, feet, or legs. °· You have not felt your baby move in over an hour. °· You have severe headaches that do not go away with medicine. °· You have vision changes. °Document Released: 03/15/2001 Document Revised: 11/21/2012 Document Reviewed:   You have severe abdominal cramping or pain.   You have rapid weight loss or gain.   You have shortness of breath with chest pain.   You notice sudden or extreme swelling of your face, hands, ankles, feet, or legs.   You have not felt your baby move in over an hour.   You have severe headaches that do not go away with medicine.   You have vision changes.  Document Released: 03/15/2001 Document Revised: 11/21/2012 Document Reviewed: 05/22/2012  [employer] Patient Information 2014 [employer].

## 2013-08-10 NOTE — MAU Provider Note (Signed)
Attestation of Attending Supervision of Advanced Practitioner (PA/CNM/NP): Evaluation and management procedures were performed by the Advanced Practitioner under my supervision and collaboration.  I have reviewed the Advanced Practitioner's note and chart, and I agree with the management and plan.  [name], MD [facility] Attending 08/10/2013 9:10 PM

## 2013-08-12 ENCOUNTER — Ambulatory Visit (INDEPENDENT_AMBULATORY_CARE_PROVIDER_SITE_OTHER): Payer: 59 | Admitting: *Deleted

## 2013-08-12 VITALS — BP 136/77 | HR 86 | Wt 251.0 lb

## 2013-08-12 DIAGNOSIS — IMO0002 Reserved for concepts with insufficient information to code with codable children: Secondary | ICD-10-CM

## 2013-08-12 NOTE — Progress Notes (Signed)
Pt here for NST - c/o headache, no vision changes, no abd pain. Pt is to get script for Flexeril today and will see if that helps with headache. NST reactive today

## 2013-08-13 ENCOUNTER — Encounter: Payer: Self-pay | Admitting: Obstetrics & Gynecology

## 2013-08-13 ENCOUNTER — Encounter (HOSPITAL_COMMUNITY): Payer: Self-pay

## 2013-08-13 ENCOUNTER — Ambulatory Visit (HOSPITAL_COMMUNITY)
Admission: RE | Admit: 2013-08-13 | Discharge: 2013-08-13 | Disposition: A | Discharge status: Home / Self Care | Payer: 59 | Source: Ambulatory Visit | Attending: Obstetrics & Gynecology | Admitting: Obstetrics & Gynecology

## 2013-08-13 VITALS — BP 139/91 | HR 95 | Wt 251.0 lb

## 2013-08-13 DIAGNOSIS — O1400 Mild to moderate pre-eclampsia, unspecified trimester: Secondary | ICD-10-CM

## 2013-08-13 DIAGNOSIS — IMO0002 Reserved for concepts with insufficient information to code with codable children: Secondary | ICD-10-CM | POA: Insufficient documentation

## 2013-08-13 DIAGNOSIS — O321XX0 Maternal care for breech presentation, not applicable or unspecified: Secondary | ICD-10-CM

## 2013-08-13 DIAGNOSIS — O99810 Abnormal glucose complicating pregnancy: Secondary | ICD-10-CM | POA: Insufficient documentation

## 2013-08-13 DIAGNOSIS — O0990 Supervision of high risk pregnancy, unspecified, unspecified trimester: Secondary | ICD-10-CM

## 2013-08-13 DIAGNOSIS — O09299 Supervision of pregnancy with other poor reproductive or obstetric history, unspecified trimester: Secondary | ICD-10-CM

## 2013-08-13 DIAGNOSIS — E669 Obesity, unspecified: Secondary | ICD-10-CM

## 2013-08-13 DIAGNOSIS — O24419 Gestational diabetes mellitus in pregnancy, unspecified control: Secondary | ICD-10-CM

## 2013-08-13 DIAGNOSIS — O09211 Supervision of pregnancy with history of pre-term labor, first trimester: Secondary | ICD-10-CM

## 2013-08-13 DIAGNOSIS — Z8279 Family history of other congenital malformations, deformations and chromosomal abnormalities: Secondary | ICD-10-CM

## 2013-08-13 DIAGNOSIS — Z3689 Encounter for other specified antenatal screening: Secondary | ICD-10-CM | POA: Insufficient documentation

## 2013-08-15 ENCOUNTER — Ambulatory Visit (INDEPENDENT_AMBULATORY_CARE_PROVIDER_SITE_OTHER): Payer: 59 | Admitting: Obstetrics & Gynecology

## 2013-08-15 VITALS — BP 149/93 | HR 111

## 2013-08-15 DIAGNOSIS — O09299 Supervision of pregnancy with other poor reproductive or obstetric history, unspecified trimester: Secondary | ICD-10-CM

## 2013-08-15 DIAGNOSIS — Z3480 Encounter for supervision of other normal pregnancy, unspecified trimester: Secondary | ICD-10-CM

## 2013-08-15 DIAGNOSIS — O0990 Supervision of high risk pregnancy, unspecified, unspecified trimester: Secondary | ICD-10-CM

## 2013-08-15 DIAGNOSIS — O09211 Supervision of pregnancy with history of pre-term labor, first trimester: Secondary | ICD-10-CM

## 2013-08-15 DIAGNOSIS — O99810 Abnormal glucose complicating pregnancy: Secondary | ICD-10-CM

## 2013-08-15 DIAGNOSIS — O24419 Gestational diabetes mellitus in pregnancy, unspecified control: Secondary | ICD-10-CM

## 2013-08-15 DIAGNOSIS — O09219 Supervision of pregnancy with history of pre-term labor, unspecified trimester: Secondary | ICD-10-CM

## 2013-08-15 NOTE — Progress Notes (Signed)
Mod blood in urine

## 2013-08-15 NOTE — Progress Notes (Signed)
Routine visit. Good FM. NST reviewed and reactive. She will have a version on 08-22-13 with Dr. Roselie Awkward at 0700 I will get cultures today. Pre eclampsia precautions reviewed. I spoke with Dr. Gwenyth Ober who recommends delivery at 24 weeks.

## 2013-08-15 NOTE — Progress Notes (Signed)
CBG- Fastings ranging from Mid 70's-low 90's  All CBG's WNL

## 2013-08-16 ENCOUNTER — Telehealth (HOSPITAL_COMMUNITY): Payer: Self-pay | Admitting: *Deleted

## 2013-08-16 LAB — GC/CHLAMYDIA PROBE AMP
CT Probe RNA: NEGATIVE
GC Probe RNA: NEGATIVE

## 2013-08-16 NOTE — Telephone Encounter (Signed)
Preadmission screen  

## 2013-08-18 LAB — CULTURE, BETA STREP (GROUP B ONLY)

## 2013-08-19 ENCOUNTER — Ambulatory Visit (INDEPENDENT_AMBULATORY_CARE_PROVIDER_SITE_OTHER): Payer: 59 | Admitting: Obstetrics and Gynecology

## 2013-08-19 VITALS — BP 154/94 | HR 92 | Wt 258.0 lb

## 2013-08-19 DIAGNOSIS — O99810 Abnormal glucose complicating pregnancy: Secondary | ICD-10-CM

## 2013-08-19 DIAGNOSIS — O1400 Mild to moderate pre-eclampsia, unspecified trimester: Secondary | ICD-10-CM

## 2013-08-19 DIAGNOSIS — IMO0002 Reserved for concepts with insufficient information to code with codable children: Secondary | ICD-10-CM

## 2013-08-19 DIAGNOSIS — Z3480 Encounter for supervision of other normal pregnancy, unspecified trimester: Secondary | ICD-10-CM

## 2013-08-19 DIAGNOSIS — O24419 Gestational diabetes mellitus in pregnancy, unspecified control: Secondary | ICD-10-CM

## 2013-08-19 LAB — CBC
HEMATOCRIT: 29.8 % — AB (ref 36.0–46.0)
Hemoglobin: 10.3 g/dL — ABNORMAL LOW (ref 12.0–15.0)
MCH: 29.3 pg (ref 26.0–34.0)
MCHC: 34.6 g/dL (ref 30.0–36.0)
MCV: 84.9 fL (ref 78.0–100.0)
Platelets: 183 10*3/uL (ref 150–400)
RBC: 3.51 MIL/uL — AB (ref 3.87–5.11)
RDW: 15.6 % — ABNORMAL HIGH (ref 11.5–15.5)
WBC: 8.8 10*3/uL (ref 4.0–10.5)

## 2013-08-19 NOTE — Progress Notes (Signed)
Well-dated. FATs reassuring. Good FM. Cephalic by [place] with normal AFV. States [employer] BP by pharmacist 164/94, with recheck lower. Still with dull constant H/A's, no longer using Fiorocet. Denies visual disturbance or epigastric pain. DTRs 1+. Last 24 hr urine was 5/3: 378mg  pro. [place] 5/12: growth 57th%ile and AC normal. CBGs all within range.  C/W Dr. Gala Romney re: increasing BP readings and other parameters stable. Plan to get preE labs today and return here tomorrow NPO for BP checks. Report if other sx develop (dec FM, visual changes, severe H/A, RUQ pain). Pt agreeable to plan.

## 2013-08-19 NOTE — Patient Instructions (Signed)
Fetal Movement Counts Patient Name: __________________________________________________ Patient Due Date: ____________________ Performing a fetal movement count is highly recommended in high-risk pregnancies, but it is good for every pregnant woman to do. Your caregiver may ask you to start counting fetal movements at 28 weeks of the pregnancy. Fetal movements often increase:  After eating a full meal.  After physical activity.  After eating or drinking something sweet or cold.  At rest. Pay attention to when you feel the baby is most active. This will help you notice a pattern of your baby's sleep and wake cycles and what factors contribute to an increase in fetal movement. It is important to perform a fetal movement count at the same time each day when your baby is normally most active.  HOW TO COUNT FETAL MOVEMENTS 1. Find a quiet and comfortable area to sit or lie down on your left side. Lying on your left side provides the best blood and oxygen circulation to your baby. 2. Write down the day and time on a sheet of paper or in a journal. 3. Start counting kicks, flutters, swishes, rolls, or jabs in a 2 hour period. You should feel at least 10 movements within 2 hours. 4. If you do not feel 10 movements in 2 hours, wait 2 3 hours and count again. Look for a change in the pattern or not enough counts in 2 hours. SEEK MEDICAL CARE IF:  You feel less than 10 counts in 2 hours, tried twice.  There is no movement in over an hour.  The pattern is changing or taking longer each day to reach 10 counts in 2 hours.  You feel the baby is not moving as he or she usually does. Date: ____________ Movements: ____________ Start time: ____________ Finish time: ____________  Date: ____________ Movements: ____________ Start time: ____________ Finish time: ____________ Date: ____________ Movements: ____________ Start time: ____________ Finish time: ____________ Date: ____________ Movements: ____________  Start time: ____________ Finish time: ____________ Date: ____________ Movements: ____________ Start time: ____________ Finish time: ____________ Date: ____________ Movements: ____________ Start time: ____________ Finish time: ____________ Date: ____________ Movements: ____________ Start time: ____________ Finish time: ____________ Date: ____________ Movements: ____________ Start time: ____________ Finish time: ____________  Date: ____________ Movements: ____________ Start time: ____________ Finish time: ____________ Date: ____________ Movements: ____________ Start time: ____________ Finish time: ____________ Date: ____________ Movements: ____________ Start time: ____________ Finish time: ____________ Date: ____________ Movements: ____________ Start time: ____________ Finish time: ____________ Date: ____________ Movements: ____________ Start time: ____________ Finish time: ____________ Date: ____________ Movements: ____________ Start time: ____________ Finish time: ____________ Date: ____________ Movements: ____________ Start time: ____________ Finish time: ____________  Date: ____________ Movements: ____________ Start time: ____________ Finish time: ____________ Date: ____________ Movements: ____________ Start time: ____________ Finish time: ____________ Date: ____________ Movements: ____________ Start time: ____________ Finish time: ____________ Date: ____________ Movements: ____________ Start time: ____________ Finish time: ____________ Date: ____________ Movements: ____________ Start time: ____________ Finish time: ____________ Date: ____________ Movements: ____________ Start time: ____________ Finish time: ____________ Date: ____________ Movements: ____________ Start time: ____________ Finish time: ____________  Date: ____________ Movements: ____________ Start time: ____________ Finish time: ____________ Date: ____________ Movements: ____________ Start time: ____________ Finish time:  ____________ Date: ____________ Movements: ____________ Start time: ____________ Finish time: ____________ Date: ____________ Movements: ____________ Start time: ____________ Finish time: ____________ Date: ____________ Movements: ____________ Start time: ____________ Finish time: ____________ Date: ____________ Movements: ____________ Start time: ____________ Finish time: ____________ Date: ____________ Movements: ____________ Start time: ____________ Finish time: ____________  Date: ____________ Movements: ____________ Start time: ____________ Finish   time: ____________ Date: ____________ Movements: ____________ Start time: ____________ Finish time: ____________ Date: ____________ Movements: ____________ Start time: ____________ Finish time: ____________ Date: ____________ Movements: ____________ Start time: ____________ Finish time: ____________ Date: ____________ Movements: ____________ Start time: ____________ Finish time: ____________ Date: ____________ Movements: ____________ Start time: ____________ Finish time: ____________ Date: ____________ Movements: ____________ Start time: ____________ Finish time: ____________  Date: ____________ Movements: ____________ Start time: ____________ Finish time: ____________ Date: ____________ Movements: ____________ Start time: ____________ Finish time: ____________ Date: ____________ Movements: ____________ Start time: ____________ Finish time: ____________ Date: ____________ Movements: ____________ Start time: ____________ Finish time: ____________ Date: ____________ Movements: ____________ Start time: ____________ Finish time: ____________ Date: ____________ Movements: ____________ Start time: ____________ Finish time: ____________ Date: ____________ Movements: ____________ Start time: ____________ Finish time: ____________  Date: ____________ Movements: ____________ Start time: ____________ Finish time: ____________ Date: ____________ Movements:  ____________ Start time: ____________ Finish time: ____________ Date: ____________ Movements: ____________ Start time: ____________ Finish time: ____________ Date: ____________ Movements: ____________ Start time: ____________ Finish time: ____________ Date: ____________ Movements: ____________ Start time: ____________ Finish time: ____________ Date: ____________ Movements: ____________ Start time: ____________ Finish time: ____________ Date: ____________ Movements: ____________ Start time: ____________ Finish time: ____________  Date: ____________ Movements: ____________ Start time: ____________ Finish time: ____________ Date: ____________ Movements: ____________ Start time: ____________ Finish time: ____________ Date: ____________ Movements: ____________ Start time: ____________ Finish time: ____________ Date: ____________ Movements: ____________ Start time: ____________ Finish time: ____________ Date: ____________ Movements: ____________ Start time: ____________ Finish time: ____________ Date: ____________ Movements: ____________ Start time: ____________ Finish time: ____________ Document Released: 04/20/2006 Document Revised: 03/07/2012 Document Reviewed: 01/16/2012 [employer] Patient Information 2014 [employer].  

## 2013-08-19 NOTE — Addendum Note (Signed)
Addended by: Asencion Islam on: 08/19/2013 11:02 AM   Modules accepted: Orders

## 2013-08-20 ENCOUNTER — Encounter (HOSPITAL_COMMUNITY): Payer: Self-pay | Admitting: *Deleted

## 2013-08-20 ENCOUNTER — Inpatient Hospital Stay (HOSPITAL_COMMUNITY)
Admission: AD | Admit: 2013-08-20 | Discharge: 2013-08-24 | DRG: 765 | Disposition: A | Discharge status: Home / Self Care | Payer: 59 | Source: Ambulatory Visit | Attending: Obstetrics & Gynecology | Admitting: Obstetrics & Gynecology

## 2013-08-20 ENCOUNTER — Ambulatory Visit: Payer: 59 | Admitting: *Deleted

## 2013-08-20 VITALS — BP 158/94 | HR 95 | Wt 259.0 lb

## 2013-08-20 DIAGNOSIS — O99214 Obesity complicating childbirth: Secondary | ICD-10-CM

## 2013-08-20 DIAGNOSIS — Z8249 Family history of ischemic heart disease and other diseases of the circulatory system: Secondary | ICD-10-CM

## 2013-08-20 DIAGNOSIS — E669 Obesity, unspecified: Secondary | ICD-10-CM | POA: Diagnosis present

## 2013-08-20 DIAGNOSIS — IMO0002 Reserved for concepts with insufficient information to code with codable children: Secondary | ICD-10-CM | POA: Diagnosis present

## 2013-08-20 DIAGNOSIS — O99814 Abnormal glucose complicating childbirth: Secondary | ICD-10-CM | POA: Diagnosis present

## 2013-08-20 DIAGNOSIS — O321XX0 Maternal care for breech presentation, not applicable or unspecified: Principal | ICD-10-CM | POA: Diagnosis present

## 2013-08-20 DIAGNOSIS — Z6830 Body mass index (BMI) 30.0-30.9, adult: Secondary | ICD-10-CM

## 2013-08-20 DIAGNOSIS — Z833 Family history of diabetes mellitus: Secondary | ICD-10-CM

## 2013-08-20 DIAGNOSIS — Z98891 History of uterine scar from previous surgery: Secondary | ICD-10-CM

## 2013-08-20 DIAGNOSIS — Z803 Family history of malignant neoplasm of breast: Secondary | ICD-10-CM

## 2013-08-20 LAB — COMPREHENSIVE METABOLIC PANEL
ALBUMIN: 2.4 g/dL — AB (ref 3.5–5.2)
ALT: 11 U/L (ref 0–35)
ALT: 11 U/L (ref 0–35)
AST: 14 U/L (ref 0–37)
AST: 16 U/L (ref 0–37)
Albumin: 2.9 g/dL — ABNORMAL LOW (ref 3.5–5.2)
Alkaline Phosphatase: 90 U/L (ref 39–117)
Alkaline Phosphatase: 91 U/L (ref 39–117)
BUN: 10 mg/dL (ref 6–23)
BUN: 12 mg/dL (ref 6–23)
CHLORIDE: 102 meq/L (ref 96–112)
CO2: 21 meq/L (ref 19–32)
CO2: 21 mEq/L (ref 19–32)
CREATININE: 0.7 mg/dL (ref 0.50–1.10)
CREATININE: 0.68 mg/dL (ref 0.50–1.10)
Calcium: 8.6 mg/dL (ref 8.4–10.5)
Calcium: 8.7 mg/dL (ref 8.4–10.5)
Chloride: 107 mEq/L (ref 96–112)
GFR calc Af Amer: >90 mL/min (ref >90)
GFR calc non Af Amer: >90 mL/min (ref >90)
Glucose, Bld: 111 mg/dL — ABNORMAL HIGH (ref 70–99)
Glucose, Bld: 85 mg/dL (ref 70–99)
Potassium: 4.4 mEq/L (ref 3.5–5.3)
Potassium: 4.7 mEq/L (ref 3.7–5.3)
Sodium: 136 mEq/L (ref 135–145)
Sodium: 137 mEq/L (ref 137–147)
Total Bilirubin: 0.2 mg/dL (ref 0.2–1.2)
Total Bilirubin: <0.2 mg/dL — ABNORMAL LOW (ref 0.3–1.2)
Total Protein: 5.8 g/dL — ABNORMAL LOW (ref 6.0–8.3)
Total Protein: 6.2 g/dL (ref 6.0–8.3)

## 2013-08-20 LAB — TYPE AND SCREEN
ABO/RH(D): B POS
Antibody Screen: NEGATIVE

## 2013-08-20 LAB — PROTEIN / CREATININE RATIO, URINE
CREATININE, URINE: 125.93 mg/dL
PROTEIN CREATININE RATIO: 0.5 — AB (ref 0.00–0.15)
Total Protein, Urine: 63.3 mg/dL

## 2013-08-20 LAB — CBC
HCT: 32.2 % — ABNORMAL LOW (ref 36.0–46.0)
Hemoglobin: 10.4 g/dL — ABNORMAL LOW (ref 12.0–15.0)
MCH: 28.4 pg (ref 26.0–34.0)
MCHC: 32.3 g/dL (ref 30.0–36.0)
MCV: 88 fL (ref 78.0–100.0)
PLATELETS: 179 10*3/uL (ref 150–400)
RBC: 3.66 MIL/uL — ABNORMAL LOW (ref 3.87–5.11)
RDW: 14.9 % (ref 11.5–15.5)
WBC: 9.5 10*3/uL (ref 4.0–10.5)

## 2013-08-20 LAB — GLUCOSE, CAPILLARY: Glucose-Capillary: 75 mg/dL (ref 70–99)

## 2013-08-20 MED ORDER — DEXAMETHASONE SODIUM PHOSPHATE 10 MG/ML IJ SOLN
10.0000 mg | Freq: Once | INTRAMUSCULAR | Status: AC
  Administered 2013-08-20: 10 mg via INTRAVENOUS
  Filled 2013-08-20: qty 1

## 2013-08-20 MED ORDER — ACETAMINOPHEN 500 MG PO TABS
1000.0000 mg | ORAL_TABLET | Freq: Once | ORAL | Status: AC
  Administered 2013-08-20: 1000 mg via ORAL
  Filled 2013-08-20: qty 2

## 2013-08-20 MED ORDER — METOCLOPRAMIDE HCL 10 MG PO TABS: 10.0000 mg | ORAL_TABLET | Freq: Once | ORAL | Status: DC

## 2013-08-20 MED ORDER — ACETAMINOPHEN 325 MG PO TABS: 650.0000 mg | ORAL_TABLET | ORAL | Status: DC | PRN

## 2013-08-20 MED ORDER — DIPHENHYDRAMINE HCL 25 MG PO CAPS: 25.0000 mg | ORAL_CAPSULE | Freq: Once | ORAL | Status: DC

## 2013-08-20 MED ORDER — DEXAMETHASONE 6 MG PO TABS: 10.0000 mg | ORAL_TABLET | Freq: Once | ORAL | Status: DC

## 2013-08-20 MED ORDER — TERBUTALINE SULFATE 1 MG/ML IJ SOLN
INTRAMUSCULAR | Status: AC
  Administered 2013-08-20: 0.25 mg
  Filled 2013-08-20: qty 1

## 2013-08-20 MED ORDER — METOCLOPRAMIDE HCL 5 MG/ML IJ SOLN
10.0000 mg | Freq: Once | INTRAMUSCULAR | Status: AC
  Administered 2013-08-20: 10 mg via INTRAVENOUS
  Filled 2013-08-20: qty 2

## 2013-08-20 MED ORDER — DIPHENHYDRAMINE HCL 50 MG/ML IJ SOLN
25.0000 mg | Freq: Once | INTRAMUSCULAR | Status: AC
  Administered 2013-08-20: 25 mg via INTRAVENOUS
  Filled 2013-08-20: qty 1

## 2013-08-20 MED ORDER — DOCUSATE SODIUM 100 MG PO CAPS: 100.0000 mg | ORAL_CAPSULE | Freq: Every day | ORAL | Status: DC

## 2013-08-20 MED ORDER — PRENATAL MULTIVITAMIN CH
1.0000 | ORAL_TABLET | Freq: Every day | ORAL | Status: DC
Start: 2013-08-20 — End: 2013-08-21

## 2013-08-20 MED ORDER — OXYCODONE-ACETAMINOPHEN 5-325 MG PO TABS
1.0000 | ORAL_TABLET | ORAL | Status: DC | PRN
  Administered 2013-08-20: 2 via ORAL
  Administered 2013-08-20 (×2): 1 via ORAL
  Administered 2013-08-21: 2 via ORAL
  Filled 2013-08-20 (×2): qty 2
  Filled 2013-08-20: qty 1
  Filled 2013-08-20: qty 2

## 2013-08-20 MED ORDER — ZOLPIDEM TARTRATE 5 MG PO TABS: 5.0000 mg | ORAL_TABLET | Freq: Every evening | ORAL | Status: DC | PRN

## 2013-08-20 MED ORDER — CYCLOBENZAPRINE HCL 10 MG PO TABS
10.0000 mg | ORAL_TABLET | Freq: Once | ORAL | Status: AC
  Administered 2013-08-20: 10 mg via ORAL
  Filled 2013-08-20: qty 1

## 2013-08-20 MED ORDER — CALCIUM CARBONATE ANTACID 500 MG PO CHEW: 2.0000 | CHEWABLE_TABLET | ORAL | Status: DC | PRN

## 2013-08-20 NOTE — MAU Note (Addendum)
Breech presentation confirmed yesterday by ultrasound in office.  Pt has been experiencing HA, blurry vision, and nausea since yesterday.  Was evaluated yesterday for BP and protein in urine.  Pt gained 4 lbs in two days.  Swelling in feet, legs, hands, and face.  Pt has been NPO since midnight.  Today, returned back to office and blood pressure was 158/96, protein 3+.  Denies vaginal bleeding or ROM.  Good fetal movement.  Pt did receive betamethasone on May 2 and 3rd when she started having some BP issue.

## 2013-08-20 NOTE — Progress Notes (Signed)
Failed version patient tolerated the attempt well.

## 2013-08-20 NOTE — Progress Notes (Signed)
Dr Kennon Rounds at bedside, discussing plan of care.  Patient has opted for a version at this time.  Consents have been signed.

## 2013-08-20 NOTE — H&P (Signed)
Olivia Moon is a 33 y.o. [id number] with IUP at [redacted]w[redacted]d presenting for preeclampsia. She was seen yesterday at [facility] office with HA and blurry vision. Baby was found to still be in breech presentation on ultrasound yesterday and mother declines any maneuvers to change position. She was told to return to clinic this morning and remain NPO overnight if c-section was indicated. Patient went to clinic this morning and found that her BP was elevated and protein found in urine. She had gained 4lbs in two days with increased swelling in her face, feet, and legs. She denies any vaginal bleeding or ROM. She was admitted on May 2-4 for preeclampsia where she received betamethasone x 2. She denies any right upper quadrant pain but still having nausea and vomiting. She has a history of preeclampsia in her prior pregnancy that delivered at 33 weeks.   Menstrual History:  OB History    Grav  Para  Term  Preterm  Abortions  TAB  SAB  Ect  Mult  Living    4  2  1  1  1   1    2       Patient's last menstrual period was 11/25/2012.   Past Medical History   Diagnosis  Date   .  Gestational diabetes mellitus, antepartum    .  Preeclampsia      "with first pregnancy"   .  Gestational diabetes     Past Surgical History   Procedure  Laterality  Date   .  None     .  No past surgeries      Family History   Problem  Relation  Age of Onset   .  Diabetes  Mother    .  Breast cancer  Maternal Grandmother    .  Hypertension  Mother    .  Breast cancer  Maternal Aunt    .  Hypertension  Maternal Grandmother    .  Hypertension  Sister    .  Diabetes  Sister    .  Heart disease  Father    .  Rheumatic fever  Father    .  Heart disease  Paternal Grandfather     History   Substance Use Topics   .  Smoking status:  Never Smoker   .  Smokeless tobacco:  Never Used   .  Alcohol Use:  No      Comment: before pregnancy    Allergies   Allergen  Reactions   .  Aspirin  Anaphylaxis    Prescriptions prior  to admission   Medication  Sig  Dispense  Refill   .  acetaminophen (TYLENOL) 500 MG tablet  Take 1,000 mg by mouth every 6 (six) hours as needed for headache.     .  Prenatal Vit-Fe Fumarate-FA (PRENATAL MULTIVITAMIN) TABS tablet  Take 1 tablet by mouth daily at 12 noon.      Review of Systems - Negative except for what is mentioned in HPI.   Physical Exam   Blood pressure 130/78, pulse 92, temperature 98.4 F (36.9 C), temperature source Oral, resp. rate 18, last menstrual period 11/25/2012, SpO2 98.00%.  GENERAL: Well-developed, well-nourished female in no acute distress.  ABDOMEN: Soft, nontender, nondistended, gravid, no RUQ pain  EXTREMITIES: Nontender, +2 pitting edema b/l lower extremities to the knees, no clonus or normal knee reflexes b/l, 2+ distal pulses.  Presentation: breech, confirmed by [place] on 5/02  FHT: Baseline rate 140 bpm Variability moderate  Accelerations present Decelerations none  Contractions: Every 10 mins  A: 33 yo [id number] at [redacted]w[redacted]d wks IUP Preeclampsia Gestational Diabetes - A2 Breech Presentation  Plan: Admit to Antenatal Obtain 24 hour urine. Monitor blood pressures.  [place], CNM

## 2013-08-20 NOTE — Progress Notes (Signed)
Pt here for a BP check per Dr Gala Romney.  Pt c/o's of headache this AM of about an 8.  She states that she feels very nauseated this AM and glucose was 81 this morning.  Spoke with Dr Gala Romney via phone and is sending patient to MAU and she will notify Dr Kennon Rounds who is covering L & D.

## 2013-08-20 NOTE — Progress Notes (Signed)
Patient ID: Olivia Moon, female   DOB: 1981-03-04, 33 y.o.   MRN: [id number] After informed verbal consent, Terbutaline 0.25 mg SQ given, ECV was attempted under Ultrasound guidance.  Infant frank breech, attempted forward roll x 2 with no meaningful movement.  Attempted backward roll x 1--even less movement.   FHR was reactive before and after the procedure.   Pt. Tolerated the procedure well. Will recommend primary C-section at 37 wks for pre-eclampsia or sooner if severe features are noted.

## 2013-08-20 NOTE — H&P (Signed)
Attestation of Attending Supervision of Advanced Practitioner (PA/CNM/NP): Evaluation and management procedures were performed by the Advanced Practitioner under my supervision and collaboration.  I have reviewed the Advanced Practitioner's note and chart, and I agree with the management and plan. I saw and examined this patient.  Her BP's are much improved on rest, however, her headache is persistent.  Will give meds and see if this improves.  If it fails to improve, would presume severe features and deliver.  She did agree to attempt at ECV.  [name], MD [facility] [place] [facility] Attending 08/20/2013 9:06 PM

## 2013-08-21 ENCOUNTER — Encounter (HOSPITAL_COMMUNITY)
Admission: AD | Disposition: A | Discharge status: Home / Self Care | Payer: Self-pay | Source: Ambulatory Visit | Attending: Obstetrics & Gynecology

## 2013-08-21 ENCOUNTER — Inpatient Hospital Stay (HOSPITAL_COMMUNITY): Payer: 59 | Admitting: Anesthesiology

## 2013-08-21 ENCOUNTER — Encounter (HOSPITAL_COMMUNITY): Payer: 59 | Admitting: Anesthesiology

## 2013-08-21 ENCOUNTER — Encounter (HOSPITAL_COMMUNITY): Payer: Self-pay | Admitting: Anesthesiology

## 2013-08-21 DIAGNOSIS — IMO0002 Reserved for concepts with insufficient information to code with codable children: Secondary | ICD-10-CM

## 2013-08-21 DIAGNOSIS — Z98891 History of uterine scar from previous surgery: Secondary | ICD-10-CM

## 2013-08-21 LAB — COMPREHENSIVE METABOLIC PANEL
ALBUMIN: 2.4 g/dL — AB (ref 3.5–5.2)
ALT: 12 U/L (ref 0–35)
AST: 14 U/L (ref 0–37)
Alkaline Phosphatase: 98 U/L (ref 39–117)
BUN: 14 mg/dL (ref 6–23)
CO2: 18 mEq/L — ABNORMAL LOW (ref 19–32)
Calcium: 8.9 mg/dL (ref 8.4–10.5)
Chloride: 101 mEq/L (ref 96–112)
Creatinine, Ser: 0.69 mg/dL (ref 0.50–1.10)
GFR calc non Af Amer: >90 mL/min (ref >90)
Glucose, Bld: 157 mg/dL — ABNORMAL HIGH (ref 70–99)
Potassium: 5.2 mEq/L (ref 3.7–5.3)
SODIUM: 135 meq/L — AB (ref 137–147)
TOTAL PROTEIN: 6.3 g/dL (ref 6.0–8.3)
Total Bilirubin: <0.2 mg/dL — ABNORMAL LOW (ref 0.3–1.2)

## 2013-08-21 LAB — CBC
HEMATOCRIT: 32.8 % — AB (ref 36.0–46.0)
HEMOGLOBIN: 10.9 g/dL — AB (ref 12.0–15.0)
MCH: 29.3 pg (ref 26.0–34.0)
MCHC: 33.2 g/dL (ref 30.0–36.0)
MCV: 88.2 fL (ref 78.0–100.0)
Platelets: 193 10*3/uL (ref 150–400)
RBC: 3.72 MIL/uL — ABNORMAL LOW (ref 3.87–5.11)
RDW: 15 % (ref 11.5–15.5)
WBC: 10.1 10*3/uL (ref 4.0–10.5)

## 2013-08-21 LAB — GLUCOSE, CAPILLARY
GLUCOSE-CAPILLARY: 138 mg/dL — AB (ref 70–99)
GLUCOSE-CAPILLARY: 129 mg/dL — AB (ref 70–99)
GLUCOSE-CAPILLARY: 102 mg/dL — AB (ref 70–99)
Glucose-Capillary: 106 mg/dL — ABNORMAL HIGH (ref 70–99)

## 2013-08-21 SURGERY — Surgical Case
Anesthesia: Spinal

## 2013-08-21 MED ORDER — ONDANSETRON HCL 4 MG/2ML IJ SOLN: 4.0000 mg | INTRAMUSCULAR | Status: DC | PRN

## 2013-08-21 MED ORDER — LANOLIN HYDROUS EX OINT: 1.0000 "application " | TOPICAL_OINTMENT | CUTANEOUS | Status: DC | PRN

## 2013-08-21 MED ORDER — ONDANSETRON HCL 4 MG/2ML IJ SOLN: 4.0000 mg | Freq: Three times a day (TID) | INTRAMUSCULAR | Status: DC | PRN

## 2013-08-21 MED ORDER — MIDAZOLAM HCL 2 MG/2ML IJ SOLN: 0.5000 mg | Freq: Once | INTRAMUSCULAR | Status: DC | PRN

## 2013-08-21 MED ORDER — SENNOSIDES-DOCUSATE SODIUM 8.6-50 MG PO TABS
2.0000 | ORAL_TABLET | ORAL | Status: DC
  Administered 2013-08-22 – 2013-08-23 (×3): 2 via ORAL
  Filled 2013-08-21 (×4): qty 2

## 2013-08-21 MED ORDER — CEFAZOLIN SODIUM-DEXTROSE 2-3 GM-% IV SOLR
2.0000 g | Freq: Once | INTRAVENOUS | Status: AC
  Administered 2013-08-21: 2 g via INTRAVENOUS
  Filled 2013-08-21: qty 50

## 2013-08-21 MED ORDER — DIPHENHYDRAMINE HCL 50 MG/ML IJ SOLN
25.0000 mg | INTRAMUSCULAR | Status: DC | PRN
  Filled 2013-08-21: qty 1

## 2013-08-21 MED ORDER — FENTANYL CITRATE 0.05 MG/ML IJ SOLN
INTRAMUSCULAR | Status: AC
  Filled 2013-08-21: qty 2

## 2013-08-21 MED ORDER — SCOPOLAMINE 1 MG/3DAYS TD PT72
MEDICATED_PATCH | TRANSDERMAL | Status: AC
  Administered 2013-08-21: 1.5 mg via TRANSDERMAL
  Filled 2013-08-21: qty 1

## 2013-08-21 MED ORDER — SIMETHICONE 80 MG PO CHEW
80.0000 mg | CHEWABLE_TABLET | ORAL | Status: DC
  Administered 2013-08-22 – 2013-08-23 (×2): 80 mg via ORAL
  Filled 2013-08-21 (×3): qty 1

## 2013-08-21 MED ORDER — CITRIC ACID-SODIUM CITRATE 334-500 MG/5ML PO SOLN
ORAL | Status: AC
  Administered 2013-08-21: 30 mL
  Filled 2013-08-21: qty 15

## 2013-08-21 MED ORDER — MEPERIDINE HCL 25 MG/ML IJ SOLN: 6.2500 mg | INTRAMUSCULAR | Status: DC | PRN

## 2013-08-21 MED ORDER — MORPHINE SULFATE 0.5 MG/ML IJ SOLN
INTRAMUSCULAR | Status: AC
  Filled 2013-08-21: qty 10

## 2013-08-21 MED ORDER — IBUPROFEN 600 MG PO TABS: 600.0000 mg | ORAL_TABLET | Freq: Four times a day (QID) | ORAL | Status: DC

## 2013-08-21 MED ORDER — DIPHENHYDRAMINE HCL 25 MG PO CAPS: 25.0000 mg | ORAL_CAPSULE | Freq: Four times a day (QID) | ORAL | Status: DC | PRN

## 2013-08-21 MED ORDER — MAGNESIUM SULFATE 40 G IN LACTATED RINGERS - SIMPLE: 2.0000 g/h | INTRAVENOUS | Status: DC

## 2013-08-21 MED ORDER — OXYCODONE HCL 5 MG PO TABS
5.0000 mg | ORAL_TABLET | ORAL | Status: DC | PRN
  Administered 2013-08-22 – 2013-08-24 (×11): 5 mg via ORAL
  Filled 2013-08-21 (×11): qty 1

## 2013-08-21 MED ORDER — NALOXONE HCL 0.4 MG/ML IJ SOLN: 0.4000 mg | INTRAMUSCULAR | Status: DC | PRN

## 2013-08-21 MED ORDER — SCOPOLAMINE 1 MG/3DAYS TD PT72
1.0000 | MEDICATED_PATCH | Freq: Once | TRANSDERMAL | Status: AC
  Administered 2013-08-21: 1.5 mg via TRANSDERMAL

## 2013-08-21 MED ORDER — NALBUPHINE HCL 10 MG/ML IJ SOLN
5.0000 mg | INTRAMUSCULAR | Status: DC | PRN
  Administered 2013-08-21 (×2): 5 mg via INTRAVENOUS
  Administered 2013-08-22: 10 mg via INTRAVENOUS
  Filled 2013-08-21 (×2): qty 1

## 2013-08-21 MED ORDER — DIPHENHYDRAMINE HCL 50 MG/ML IJ SOLN
12.5000 mg | INTRAMUSCULAR | Status: DC | PRN
  Administered 2013-08-22: 12.5 mg via INTRAVENOUS

## 2013-08-21 MED ORDER — PHENYLEPHRINE 8 MG IN D5W 100 ML (0.08MG/ML) PREMIX OPTIME
INJECTION | INTRAVENOUS | Status: DC | PRN
  Administered 2013-08-21: 60 ug/min via INTRAVENOUS

## 2013-08-21 MED ORDER — PHENYLEPHRINE 8 MG IN D5W 100 ML (0.08MG/ML) PREMIX OPTIME
INJECTION | INTRAVENOUS | Status: AC
  Filled 2013-08-21: qty 100

## 2013-08-21 MED ORDER — OXYTOCIN 10 UNIT/ML IJ SOLN
40.0000 [IU] | INTRAVENOUS | Status: DC | PRN
  Administered 2013-08-21: 40 [IU] via INTRAVENOUS

## 2013-08-21 MED ORDER — LACTATED RINGERS IV SOLN
INTRAVENOUS | Status: DC
  Administered 2013-08-21: 08:00:00 via INTRAVENOUS

## 2013-08-21 MED ORDER — ONDANSETRON HCL 4 MG/2ML IJ SOLN
INTRAMUSCULAR | Status: AC
  Filled 2013-08-21: qty 2

## 2013-08-21 MED ORDER — SIMETHICONE 80 MG PO CHEW
80.0000 mg | CHEWABLE_TABLET | ORAL | Status: DC | PRN
  Administered 2013-08-24: 80 mg via ORAL

## 2013-08-21 MED ORDER — MAGNESIUM HYDROXIDE 400 MG/5ML PO SUSP
30.0000 mL | ORAL | Status: DC | PRN
  Filled 2013-08-21: qty 30

## 2013-08-21 MED ORDER — PRENATAL MULTIVITAMIN CH
1.0000 | ORAL_TABLET | Freq: Every day | ORAL | Status: DC
  Administered 2013-08-22 – 2013-08-24 (×3): 1 via ORAL
  Filled 2013-08-21 (×3): qty 1

## 2013-08-21 MED ORDER — NALBUPHINE HCL 10 MG/ML IJ SOLN
5.0000 mg | INTRAMUSCULAR | Status: DC | PRN
  Administered 2013-08-21: 10 mg via SUBCUTANEOUS
  Filled 2013-08-21 (×3): qty 1

## 2013-08-21 MED ORDER — DIBUCAINE 1 % RE OINT
1.0000 | TOPICAL_OINTMENT | RECTAL | Status: DC | PRN
Start: 2013-08-21 — End: 2013-08-24

## 2013-08-21 MED ORDER — PHENYLEPHRINE 40 MCG/ML (10ML) SYRINGE FOR IV PUSH (FOR BLOOD PRESSURE SUPPORT)
PREFILLED_SYRINGE | INTRAVENOUS | Status: AC
  Filled 2013-08-21: qty 5

## 2013-08-21 MED ORDER — ONDANSETRON HCL 4 MG/2ML IJ SOLN
INTRAMUSCULAR | Status: DC | PRN
  Administered 2013-08-21: 4 mg via INTRAVENOUS

## 2013-08-21 MED ORDER — LACTATED RINGERS IV SOLN
INTRAVENOUS | Status: DC | PRN
  Administered 2013-08-21 (×2): via INTRAVENOUS

## 2013-08-21 MED ORDER — LACTATED RINGERS IV SOLN
INTRAVENOUS | Status: DC
  Administered 2013-08-21: 23:00:00 via INTRAVENOUS

## 2013-08-21 MED ORDER — NALBUPHINE HCL 10 MG/ML IJ SOLN
INTRAMUSCULAR | Status: AC
  Administered 2013-08-21: 10 mg via SUBCUTANEOUS
  Filled 2013-08-21: qty 1

## 2013-08-21 MED ORDER — MENTHOL 3 MG MT LOZG: 1.0000 | LOZENGE | OROMUCOSAL | Status: DC | PRN

## 2013-08-21 MED ORDER — ZOLPIDEM TARTRATE 5 MG PO TABS: 5.0000 mg | ORAL_TABLET | Freq: Every evening | ORAL | Status: DC | PRN

## 2013-08-21 MED ORDER — ACETAMINOPHEN 500 MG PO TABS
1000.0000 mg | ORAL_TABLET | Freq: Four times a day (QID) | ORAL | Status: AC
  Administered 2013-08-21 – 2013-08-22 (×3): 1000 mg via ORAL
  Filled 2013-08-21 (×4): qty 2

## 2013-08-21 MED ORDER — NALOXONE HCL 1 MG/ML IJ SOLN
1.0000 ug/kg/h | INTRAVENOUS | Status: DC | PRN
  Filled 2013-08-21: qty 2

## 2013-08-21 MED ORDER — BUPIVACAINE IN DEXTROSE 0.75-8.25 % IT SOLN
INTRATHECAL | Status: DC | PRN
  Administered 2013-08-21: 1.5 mL via INTRATHECAL

## 2013-08-21 MED ORDER — TETANUS-DIPHTH-ACELL PERTUSSIS 5-2.5-18.5 LF-MCG/0.5 IM SUSP
0.5000 mL | Freq: Once | INTRAMUSCULAR | Status: DC
  Filled 2013-08-21: qty 0.5

## 2013-08-21 MED ORDER — SIMETHICONE 80 MG PO CHEW
80.0000 mg | CHEWABLE_TABLET | Freq: Three times a day (TID) | ORAL | Status: DC
  Administered 2013-08-21 – 2013-08-23 (×9): 80 mg via ORAL
  Filled 2013-08-21 (×10): qty 1

## 2013-08-21 MED ORDER — PROMETHAZINE HCL 25 MG/ML IJ SOLN
INTRAMUSCULAR | Status: AC
  Filled 2013-08-21: qty 1

## 2013-08-21 MED ORDER — SODIUM CHLORIDE 0.9 % IJ SOLN: 3.0000 mL | INTRAMUSCULAR | Status: DC | PRN

## 2013-08-21 MED ORDER — OXYTOCIN 40 UNITS IN LACTATED RINGERS INFUSION - SIMPLE MED
62.5000 mL/h | INTRAVENOUS | Status: AC
Start: 2013-08-21 — End: 2013-08-22

## 2013-08-21 MED ORDER — PROMETHAZINE HCL 25 MG/ML IJ SOLN
6.2500 mg | INTRAMUSCULAR | Status: DC | PRN
  Administered 2013-08-21: 6.25 mg via INTRAVENOUS

## 2013-08-21 MED ORDER — MEASLES, MUMPS & RUBELLA VAC ~~LOC~~ INJ
0.5000 mL | INJECTION | Freq: Once | SUBCUTANEOUS | Status: DC
  Filled 2013-08-21: qty 0.5

## 2013-08-21 MED ORDER — OXYCODONE-ACETAMINOPHEN 5-325 MG PO TABS
1.0000 | ORAL_TABLET | ORAL | Status: DC | PRN
  Administered 2013-08-21: 1 via ORAL
  Filled 2013-08-21: qty 2

## 2013-08-21 MED ORDER — MAGNESIUM SULFATE 40 G IN LACTATED RINGERS - SIMPLE
2.0000 g/h | INTRAVENOUS | Status: DC
  Filled 2013-08-21: qty 500

## 2013-08-21 MED ORDER — DIPHENHYDRAMINE HCL 25 MG PO CAPS: 25.0000 mg | ORAL_CAPSULE | ORAL | Status: DC | PRN

## 2013-08-21 MED ORDER — ONDANSETRON HCL 4 MG PO TABS: 4.0000 mg | ORAL_TABLET | ORAL | Status: DC | PRN

## 2013-08-21 MED ORDER — LACTATED RINGERS IV SOLN
INTRAVENOUS | Status: DC | PRN
  Administered 2013-08-21: 11:00:00 via INTRAVENOUS

## 2013-08-21 MED ORDER — FENTANYL CITRATE 0.05 MG/ML IJ SOLN
INTRAMUSCULAR | Status: DC | PRN
  Administered 2013-08-21: 25 ug via INTRATHECAL

## 2013-08-21 MED ORDER — METOCLOPRAMIDE HCL 5 MG/ML IJ SOLN: 10.0000 mg | Freq: Three times a day (TID) | INTRAMUSCULAR | Status: DC | PRN

## 2013-08-21 MED ORDER — PHENYLEPHRINE HCL 10 MG/ML IJ SOLN
INTRAMUSCULAR | Status: DC | PRN
  Administered 2013-08-21: 80 ug via INTRAVENOUS

## 2013-08-21 MED ORDER — MORPHINE SULFATE (PF) 0.5 MG/ML IJ SOLN
INTRAMUSCULAR | Status: DC | PRN
  Administered 2013-08-21: .15 mg via EPIDURAL

## 2013-08-21 MED ORDER — OXYTOCIN 10 UNIT/ML IJ SOLN
INTRAMUSCULAR | Status: AC
Start: 2013-08-21 — End: 2013-08-21
  Filled 2013-08-21: qty 4

## 2013-08-21 MED ORDER — FENTANYL CITRATE 0.05 MG/ML IJ SOLN: 25.0000 ug | INTRAMUSCULAR | Status: DC | PRN

## 2013-08-21 MED ORDER — MAGNESIUM SULFATE BOLUS VIA INFUSION
4.0000 g | Freq: Once | INTRAVENOUS | Status: AC
  Administered 2013-08-21: 4 g via INTRAVENOUS
  Filled 2013-08-21: qty 500

## 2013-08-21 MED ORDER — WITCH HAZEL-GLYCERIN EX PADS: 1.0000 "application " | MEDICATED_PAD | CUTANEOUS | Status: DC | PRN

## 2013-08-21 SURGICAL SUPPLY — 27 items
BARRIER ADHS 3X4 INTERCEED (GAUZE/BANDAGES/DRESSINGS) IMPLANT
BRR ADH 4X3 ABS CNTRL BYND (GAUZE/BANDAGES/DRESSINGS)
CLAMP CORD UMBIL (MISCELLANEOUS) IMPLANT
CLOTH BEACON ORANGE TIMEOUT ST (SAFETY) ×2 IMPLANT
DRAPE LG THREE QUARTER DISP (DRAPES) IMPLANT
DRSG OPSITE POSTOP 4X10 (GAUZE/BANDAGES/DRESSINGS) ×2 IMPLANT
DURAPREP 26ML APPLICATOR (WOUND CARE) ×2 IMPLANT
ELECT REM PT RETURN 9FT ADLT (ELECTROSURGICAL) ×2
ELECTRODE REM PT RTRN 9FT ADLT (ELECTROSURGICAL) ×1 IMPLANT
EXTRACTOR VACUUM KIWI (MISCELLANEOUS) IMPLANT
GLOVE BIO SURGEON STRL SZ 6.5 (GLOVE) ×2 IMPLANT
GLOVE BIOGEL PI IND STRL 7.0 (GLOVE) ×1 IMPLANT
GLOVE BIOGEL PI INDICATOR 7.0 (GLOVE) ×1
GOWN STRL REUS W/TWL LRG LVL3 (GOWN DISPOSABLE) ×4 IMPLANT
KIT ABG SYR 3ML LUER SLIP (SYRINGE) IMPLANT
NDL HYPO 25X5/8 SAFETYGLIDE (NEEDLE) IMPLANT
NEEDLE HYPO 25X5/8 SAFETYGLIDE (NEEDLE) IMPLANT
NS IRRIG 1000ML POUR BTL (IV SOLUTION) ×2 IMPLANT
PACK C SECTION WH (CUSTOM PROCEDURE TRAY) ×2 IMPLANT
PAD OB MATERNITY 4.3X12.25 (PERSONAL CARE ITEMS) ×2 IMPLANT
SUT VIC AB 0 CT1 36 (SUTURE) ×12 IMPLANT
SUT VIC AB 2-0 CT1 27 (SUTURE) ×2
SUT VIC AB 2-0 CT1 TAPERPNT 27 (SUTURE) ×1 IMPLANT
SUT VIC AB 4-0 PS2 27 (SUTURE) ×2 IMPLANT
TOWEL OR 17X24 6PK STRL BLUE (TOWEL DISPOSABLE) ×2 IMPLANT
TRAY FOLEY CATH 14FR (SET/KITS/TRAYS/PACK) IMPLANT
WATER STERILE IRR 1000ML POUR (IV SOLUTION) ×2 IMPLANT

## 2013-08-21 NOTE — Progress Notes (Signed)
Ur chart review completed.  

## 2013-08-21 NOTE — Anesthesia Postprocedure Evaluation (Signed)
  Anesthesia Post Note  Patient: Olivia Moon  Procedure(s) Performed: Procedure(s) (LRB): CESAREAN SECTION (N/A)  Anesthesia type: Spinal  Patient location: PACU  Post pain: Pain level controlled  Post assessment: Post-op Vital signs reviewed  Last Vitals:  Filed Vitals:   08/21/13 1200  BP: 131/61  Pulse: 80  Temp:   Resp: 22    Post vital signs: Reviewed  Level of consciousness: awake  Complications: No apparent anesthesia complications

## 2013-08-21 NOTE — Progress Notes (Signed)
I offered support to [facility] just prior to her being taken for her C-Section.  I also offered on-going checking in and well wishes to her family who were appreciative of the support.  [employer] Pager, [phone] 3:13 PM  08/21/13 1500  Clinical Encounter Type  Visited With Patient;Family  Visit Type Spiritual support

## 2013-08-21 NOTE — Transfer of Care (Signed)
Immediate Anesthesia Transfer of Care Note  Patient: Olivia Moon  Procedure(s) Performed: Procedure(s): CESAREAN SECTION (N/A)  Patient Location: PACU  Anesthesia Type:Spinal  Level of Consciousness: awake, alert , oriented and patient cooperative  Airway & Oxygen Therapy: Patient Spontanous Breathing  Post-op Assessment: Report given to PACU RN and Post -op Vital signs reviewed and stable  Post vital signs: Reviewed and stable  Complications: No apparent anesthesia complications

## 2013-08-21 NOTE — Anesthesia Preprocedure Evaluation (Signed)
Anesthesia Evaluation  Patient identified by MRN, date of birth, ID band Patient awake    Reviewed: Allergy & Precautions, H&P , NPO status , Patient's Chart, lab work & pertinent test results  Airway Mallampati: III      Dental   Pulmonary  breath sounds clear to auscultation        Cardiovascular Exercise Tolerance: Good hypertension, Rhythm:regular Rate:Normal     Neuro/Psych    GI/Hepatic   Endo/Other  diabetesMorbid obesity  Renal/GU      Musculoskeletal   Abdominal   Peds  Hematology   Anesthesia Other Findings   Reproductive/Obstetrics (+) Pregnancy                           Anesthesia Physical Anesthesia Plan  ASA: III  Anesthesia Plan: Spinal   Post-op Pain Management:    Induction:   Airway Management Planned:   Additional Equipment:   Intra-op Plan:   Post-operative Plan:   Informed Consent: I have reviewed the patients History and Physical, chart, labs and discussed the procedure including the risks, benefits and alternatives for the proposed anesthesia with the patient or authorized representative who has indicated his/her understanding and acceptance.     Plan Discussed with: Anesthesiologist, CRNA and Surgeon  Anesthesia Plan Comments:         Anesthesia Quick Evaluation

## 2013-08-21 NOTE — Lactation Note (Addendum)
This note was copied from the chart of [place]. Lactation Consultation Note  Patient Name: Olivia Moon [id number] Date: 08/21/2013 Reason for consult: Follow-up assessment Baby 10 hours of life. Asked to see patient due to low OTs and mom not wanting to supplement. Mom reports that baby nursed well for 35 minutes with audible swallows. Mom is an experienced breastfeeder. While assessing mom and baby, baby's serum glucose drawn and OT 43 according to nursery nurse at bedside. Enc FOB to offer baby STS while LC assisted mom to hand express several drops 2-30mls of colostrum/transitional milk from both breast. Baby given drops with gloved finger. Enc mom to nurse baby again around 10:30, prior to next OT. Mom states that she pumped earlier, she thinks around 6:30, and got a few drops that she fed to the infant with her finger. Enc mom to keep pumping every 3 hours and offering EBM to baby. Also enc mom to limit baby's feedings to 30 minutes. Enc mom to call out for assistance as needed. Consulted with nursery nurse to discuss assessment of dyad and feeding of colostrum to infant.  Maternal Data    Feeding Feeding Type: Breast Fed Length of feed: 35 min  LATCH Score/Interventions Latch:  (Baby just nursed a little earlier for 35 minutes  so assisted mom to hand express.) Intervention(s): Adjust position;Assist with latch  Audible Swallowing: None Intervention(s): Skin to skin;Hand expression Intervention(s): Skin to skin  Type of Nipple: Everted at rest and after stimulation  Comfort (Breast/Nipple): Soft / non-tender     Hold (Positioning): No assistance needed to correctly position infant at breast.  LATCH Score: 7  Lactation Tools Discussed/Used     Consult Status Consult Status: Follow-up Follow-up type: In-patient    Andres Labrum 08/21/2013, 9:37 PM

## 2013-08-21 NOTE — Progress Notes (Signed)
Patient ID: Olivia Moon, female   DOB: 09-24-1980, 33 y.o.   MRN: [id number] [place]) NOTE  Olivia Moon is a 33 y.o. [id number] at [redacted]w[redacted]d by best clinical estimate who is admitted for persistent headache and elevated blood pressure.   Fetal presentation is breech. Length of Stay:  1  Days  Subjective: Pt. States headache is persistent despite multiple doses of pain medication. BP is better on rest. Patient reports the fetal movement as active. Patient reports uterine contraction  activity as none. Patient reports  vaginal bleeding as none. Patient describes fluid per vagina as None.  Vitals:  Blood pressure 126/62, pulse 92, temperature 98.3 F (36.8 C), temperature source Oral, resp. rate 20, height 5' 3.5" (1.613 m), weight 256 lb (116.121 kg), last menstrual period 11/25/2012, SpO2 98.00%. Physical Examination:  General appearance - alert, well appearing, and in no distress Abdomen - gravid, NT Fundal Height:  size equals dates Extremities: edema 2-3+  DTR's: 1+, no clonus Membranes:intact  Fetal Monitoring:  Baseline: 150 bpm, Variability: Good {> 6 bpm), Accelerations: Reactive and Decelerations: Absent  Labs:  Results for orders placed during the hospital encounter of 08/20/13 (from the past 24 hour(s))  PROTEIN / CREATININE RATIO, URINE   Collection Time    08/20/13  9:30 AM      Result Value Ref Range   Creatinine, Urine 125.93     Total Protein, Urine 63.3     PROTEIN CREATININE RATIO 0.50 (*) 0.00 - 0.15  CBC   Collection Time    08/20/13 10:25 AM      Result Value Ref Range   WBC 9.5  4.0 - 10.5 K/uL   RBC 3.66 (*) 3.87 - 5.11 MIL/uL   Hemoglobin 10.4 (*) 12.0 - 15.0 g/dL   HCT 32.2 (*) 36.0 - 46.0 %   MCV 88.0  78.0 - 100.0 fL   MCH 28.4  26.0 - 34.0 pg   MCHC 32.3  30.0 - 36.0 g/dL   RDW 14.9  11.5 - 15.5 %   Platelets 179  150 - 400 K/uL  COMPREHENSIVE METABOLIC PANEL   Collection Time    08/20/13 10:25 AM   Result Value Ref Range   Sodium 137  137 - 147 mEq/L   Potassium 4.7  3.7 - 5.3 mEq/L   Chloride 102  96 - 112 mEq/L   CO2 21  19 - 32 mEq/L   Glucose, Bld 85  70 - 99 mg/dL   BUN 12  6 - 23 mg/dL   Creatinine, Ser 0.68  0.50 - 1.10 mg/dL   Calcium 8.7  8.4 - 10.5 mg/dL   Total Protein 6.2  6.0 - 8.3 g/dL   Albumin 2.4 (*) 3.5 - 5.2 g/dL   AST 16  0 - 37 U/L   ALT 11  0 - 35 U/L   Alkaline Phosphatase 91  39 - 117 U/L   Total Bilirubin <0.2 (*) 0.3 - 1.2 mg/dL   GFR calc non Af Amer >90  >90 mL/min   GFR calc Af Amer >90  >90 mL/min  TYPE AND SCREEN   Collection Time    08/20/13 10:25 AM      Result Value Ref Range   ABO/RH(D) B POS     Antibody Screen NEG     Sample Expiration 08/23/2013    GLUCOSE, CAPILLARY   Collection Time    08/20/13  1:02 PM      Result Value Ref Range  Glucose-Capillary 75  70 - 99 mg/dL  COMPREHENSIVE METABOLIC PANEL   Collection Time    08/21/13  5:35 AM      Result Value Ref Range   Sodium 135 (*) 137 - 147 mEq/L   Potassium 5.2  3.7 - 5.3 mEq/L   Chloride 101  96 - 112 mEq/L   CO2 18 (*) 19 - 32 mEq/L   Glucose, Bld 157 (*) 70 - 99 mg/dL   BUN 14  6 - 23 mg/dL   Creatinine, Ser 0.69  0.50 - 1.10 mg/dL   Calcium 8.9  8.4 - 10.5 mg/dL   Total Protein 6.3  6.0 - 8.3 g/dL   Albumin 2.4 (*) 3.5 - 5.2 g/dL   AST 14  0 - 37 U/L   ALT 12  0 - 35 U/L   Alkaline Phosphatase 98  39 - 117 U/L   Total Bilirubin <0.2 (*) 0.3 - 1.2 mg/dL   GFR calc non Af Amer >90  >90 mL/min   GFR calc Af Amer >90  >90 mL/min  CBC   Collection Time    08/21/13  5:35 AM      Result Value Ref Range   WBC 10.1  4.0 - 10.5 K/uL   RBC 3.72 (*) 3.87 - 5.11 MIL/uL   Hemoglobin 10.9 (*) 12.0 - 15.0 g/dL   HCT 32.8 (*) 36.0 - 46.0 %   MCV 88.2  78.0 - 100.0 fL   MCH 29.3  26.0 - 34.0 pg   MCHC 33.2  30.0 - 36.0 g/dL   RDW 15.0  11.5 - 15.5 %   Platelets 193  150 - 400 K/uL   Pr:Cr ratio is 0.5  Medications:  Scheduled . docusate sodium  100 mg Oral Daily  .  prenatal multivitamin  1 tablet Oral Q1200   I have reviewed the patient's current medications.  ASSESSMENT: Patient Active Problem List   Diagnosis Date Noted  . Hx of preeclampsia, prior pregnancy, currently pregnant 01/23/2013    Priority: High  . Pregnancy complicated by previous preterm labor in first trimester 01/23/2013    Priority: Medium  . Preeclampsia 08/03/2013  . Breech presentation, antepartum 07/22/2013  . Gestational diabetes mellitus, antepartum 06/27/2013  . Supervision of high-risk pregnancy 06/03/2013  . Family history of congenital heart defect 01/23/2013  . Obesity (BMI 30-39.9) 01/23/2013    PLAN: Begin Magnesium Sulfate Plan for scheduled C-section for pre-eclampsia with severe features based on persistent headache unresolved with medication and breech presentation, s/p failed ECV. BP's were worse over last several days, to 158/98 in clinic.  [name], MD 08/21/2013,6:41 AM

## 2013-08-21 NOTE — Op Note (Signed)
Olivia Moon PROCEDURE DATE: 08/20/2013 - 08/21/2013  PREOPERATIVE DIAGNOSES: Intrauterine pregnancy at  [redacted]w[redacted]d weeks gestation; pre-eclampsia with severe features, breech presentation s/p failed version  POSTOPERATIVE DIAGNOSES: The same  PROCEDURE: Primary Low Transverse Cesarean Section  SURGEON:  Dr. Emeterio Reeve  ASSISTANT:  Dr. Ebbie Latus, Ricka Burdock PA Student  ANESTHESIOLOGIST: Dr. Rudean Curt.   INDICATIONS: Olivia Moon is a 33 y.o. [phone] at [redacted]w[redacted]d here for cesarean section secondary to the indications listed under preoperative diagnoses; please see preoperative note for further details.  The risks of cesarean section were discussed with the patient including but were not limited to: bleeding which may require transfusion or reoperation; infection which may require antibiotics; injury to bowel, bladder, ureters or other surrounding organs; injury to the fetus; need for additional procedures including hysterectomy in the event of a life-threatening hemorrhage; placental abnormalities wth subsequent pregnancies, incisional problems, thromboembolic phenomenon and other postoperative/anesthesia complications.   The patient concurred with the proposed plan, giving informed written consent for the procedure.    FINDINGS:  Viable female infant in breech presentation.  Apgars 7 and 9.  Clear amniotic fluid.  Intact placenta, three vessel cord.  Normal uterus, fallopian tubes and ovaries bilaterally.  ANESTHESIA: Spinal INTRAVENOUS FLUIDS: 1700 ml ESTIMATED BLOOD LOSS: 600 ml URINE OUTPUT:  100 ml SPECIMENS: Placenta sent to L&D COMPLICATIONS: None immediate  PROCEDURE IN DETAIL:  Pt has carried the diagnosis of pr-eclampsia for several weeks with intermittent headaches that initially improved with medication but had progressed to being constant without improvement.  Thus she was determined to have pre-eclampsia with severe features necessitating delivery. After a failed  version for breech, the decision was made to proceed with primary cesarean section.    The patient preoperatively received intravenous antibiotics and had sequential compression devices applied to her lower extremities.  She was then taken to the operating room where spinal anesthesia was administered and was found to be adequate. She was then placed in a dorsal supine position with a leftward tilt, and prepped and draped in a sterile manner.  A foley catheter was placed into her bladder and attached to constant gravity.  After an adequate timeout was performed, a Pfannenstiel skin incision was made with scalpel and carried through to the underlying layer of fascia. The fascia was incised in the midline, and this incision was extended bilaterally using the Mayo scissors.  Kocher clamps were applied to the superior aspect of the fascial incision and the underlying rectus muscles were dissected off bluntly. A similar process was carried out on the inferior aspect of the fascial incision. The rectus muscles were separated in the midline bluntly and the peritoneum was entered bluntly. Attention was turned to the lower uterine segment where a low transverse hysterotomy was made with a scalpel and extended bilaterally bluntly.  The infant was successfully delivered from breech presentation in the usual fashion, the cord was clamped and cut and the infant was handed over to awaiting neonatology team. Uterine massage was then administered, and the placenta delivered intact with a three-vessel cord. The uterus was then cleared of clot and debris.  The hysterotomy was closed with 0 Vicryl in a running locked fashion, and an imbricating layer was also placed with 0 Vicryl. The pelvis was cleared of all clot and debris. Hemostasis was confirmed on all surfaces.  The fascia was then closed using 0 Vicry in a running fashion.  The subcutaneous layer was irrigated, and then reapproximated with 2-0 plain gut  interrupted stitches.   The skin was closed with a 4-0 Vicryl subcuticular stitch. The patient tolerated the procedure well. Sponge, lap, instrument and needle counts were correct x 2.  She was taken to the recovery room in stable condition.   Kassie Mends, MD

## 2013-08-21 NOTE — Anesthesia Procedure Notes (Signed)
Spinal  Patient location during procedure: OR Start time: 08/21/2013 10:38 AM Staffing Anesthesiologist: Rudean Curt Performed by: anesthesiologist  Preanesthetic Checklist Completed: patient identified, site marked, surgical consent, pre-op evaluation, timeout performed, IV checked, risks and benefits discussed and monitors and equipment checked Spinal Block Patient position: sitting Prep: DuraPrep Patient monitoring: heart rate, cardiac monitor, continuous pulse ox and blood pressure Approach: midline Location: L3-4 Injection technique: single-shot Needle Needle type: Sprotte  Needle gauge: 24 G Needle length: 9 cm Assessment Sensory level: T4 Additional Notes Patient identified.  Risk benefits discussed including failed block, incomplete pain control, headache, nerve damage, paralysis, blood pressure changes, nausea, vomiting, reactions to medication both toxic or allergic, and postpartum back pain.  Patient expressed understanding and wished to proceed.  All questions were answered.  Sterile technique used throughout procedure.  CSF was clear.  No parasthesia or other complications.  Please see nursing notes for vital signs.

## 2013-08-22 ENCOUNTER — Inpatient Hospital Stay (HOSPITAL_COMMUNITY): Admission: RE | Admit: 2013-08-22 | Payer: 59 | Source: Ambulatory Visit

## 2013-08-22 ENCOUNTER — Encounter: Payer: 59 | Admitting: Obstetrics & Gynecology

## 2013-08-22 ENCOUNTER — Encounter (HOSPITAL_COMMUNITY): Payer: Self-pay | Admitting: Obstetrics & Gynecology

## 2013-08-22 ENCOUNTER — Encounter: Payer: Self-pay | Admitting: Obstetrics & Gynecology

## 2013-08-22 LAB — CBC
HCT: 27 % — ABNORMAL LOW (ref 36.0–46.0)
HEMOGLOBIN: 8.8 g/dL — AB (ref 12.0–15.0)
MCH: 29.1 pg (ref 26.0–34.0)
MCHC: 32.6 g/dL (ref 30.0–36.0)
MCV: 89.4 fL (ref 78.0–100.0)
Platelets: 157 10*3/uL (ref 150–400)
RBC: 3.02 MIL/uL — ABNORMAL LOW (ref 3.87–5.11)
RDW: 15.6 % — ABNORMAL HIGH (ref 11.5–15.5)
WBC: 9.7 10*3/uL (ref 4.0–10.5)

## 2013-08-22 LAB — BIRTH TISSUE RECOVERY COLLECTION (PLACENTA DONATION)

## 2013-08-22 MED ORDER — SODIUM CHLORIDE 0.9 % IJ SOLN
3.0000 mL | Freq: Two times a day (BID) | INTRAMUSCULAR | Status: DC
  Administered 2013-08-22: 3 mL via INTRAVENOUS

## 2013-08-22 MED ORDER — SODIUM CHLORIDE 0.9 % IJ SOLN: 3.0000 mL | INTRAMUSCULAR | Status: DC | PRN

## 2013-08-22 MED ORDER — ACETAMINOPHEN 325 MG PO TABS
650.0000 mg | ORAL_TABLET | Freq: Four times a day (QID) | ORAL | Status: DC | PRN
  Administered 2013-08-22 – 2013-08-23 (×6): 650 mg via ORAL
  Administered 2013-08-24: 325 mg via ORAL
  Administered 2013-08-24 (×2): 650 mg via ORAL
  Filled 2013-08-22 (×10): qty 2

## 2013-08-22 NOTE — Progress Notes (Signed)
Subjective: Postpartum Day 1: Cesarean Delivery Patient reports that she still has a lot of pretibial edema, no h/a, scotoma, no  bp icrease.Pt is pumping , noting converting to milk from colostrum.    Objective: Vital signs in last 24 hours: Temp:  [97.4 F (36.3 C)-98.4 F (36.9 C)] 98.1 F (36.7 C) (05/21 0400) Pulse Rate:  [78-106] 90 (05/21 0704) Resp:  [12-31] 16 (05/21 0532) BP: (107-151)/(48-98) 124/74 mmHg (05/21 0723) SpO2:  [92 %-98 %] 95 % (05/21 0704) Weight:  [113.762 kg (250 lb 12.8 oz)] 113.762 kg (250 lb 12.8 oz) (05/20 1400)  Intake/Output Summary (Last 24 hours) at 08/22/13 0757 Last data filed at 08/22/13 0700  Gross per 24 hour  Intake 5898.76 ml  Output   2410 ml  Net 3488.76 ml  urine output has increased in the last few hours.  Physical Exam:  General: alert, cooperative and no distress Lochia: appropriate Uterine Fundus: firm Incision: healing well, no dehiscence, no significant erythema DVT Evaluation: No evidence of DVT seen on physical exam. Calf/Ankle edema is present. Pt wished to avoid diuretic to avoid effect on milk volume   Recent Labs  08/21/13 0535 08/22/13 0550  HGB 10.9* 8.8*  HCT 32.8* 27.0*    Assessment/Plan: Status post Cesarean section. Postoperative course complicated by preeclampsia, completing Mg regimen with good bp's will d/c mag midday and transfer to floor. will order transfer.to regular pp floor  Discharge from ICU today to be ordered.. Mg to be d/c at end of current bag  Jonnie Kind 08/22/2013, 7:55 AM

## 2013-08-22 NOTE — Anesthesia Postprocedure Evaluation (Signed)
Anesthesia Post Note  Patient: Olivia Moon  Procedure(s) Performed: Procedure(s) (LRB): CESAREAN SECTION (N/A)  Anesthesia type: SAB  Patient location: Mother/Baby  Post pain: Pain level controlled  Post assessment: Post-op Vital signs reviewed  Last Vitals:  Filed Vitals:   08/22/13 0800  BP: 112/65  Pulse:   Temp:   Resp:     Post vital signs: Reviewed  Level of consciousness: awake  Complications: No apparent anesthesia complications

## 2013-08-22 NOTE — Addendum Note (Signed)
Addendum created 08/22/13 7782 by Talbot Grumbling, CRNA   Modules edited: Notes Section   Notes Section:  File: [id number]

## 2013-08-22 NOTE — Progress Notes (Signed)
UR chart review completed.  

## 2013-08-23 MED ORDER — BREAST MILK
ORAL | Status: DC
  Filled 2013-08-23: qty 1

## 2013-08-23 MED ORDER — HYDROCHLOROTHIAZIDE 12.5 MG PO CAPS
12.5000 mg | ORAL_CAPSULE | Freq: Every day | ORAL | Status: DC
  Administered 2013-08-23 – 2013-08-24 (×2): 12.5 mg via ORAL
  Filled 2013-08-23 (×3): qty 1

## 2013-08-23 NOTE — Progress Notes (Signed)
Subjective: Postpartum Day 2: Cesarean Delivery Patient reports nausea, vomiting, incisional pain, tolerating PO, + flatus and no problems voiding.    Objective: Vital signs in last 24 hours: Temp:  [97.7 F (36.5 C)-99.4 F (37.4 C)] 98 F (36.7 C) (05/22 0405) Pulse Rate:  [87-106] 93 (05/22 0405) Resp:  [16-20] 16 (05/22 0405) BP: (109-127)/(60-84) 109/70 mmHg (05/22 0405) SpO2:  [95 %-97 %] 97 % (05/21 1725)  Physical Exam:  General: alert, cooperative, no distress and moderately obese Lochia: appropriate Uterine Fundus: firm Incision: healing well, no significant drainage, no dehiscence, no significant erythema DVT Evaluation: No evidence of DVT seen on physical exam. Negative Homan's sign. No cords or calf tenderness. Calf/Ankle edema is present.   Recent Labs  08/21/13 0535 08/22/13 0550  HGB 10.9* 8.8*  HCT 32.8* 27.0*    Assessment/Plan: Status post Cesarean section. Postoperative course complicated by preecmapsia, Mg completed midday yesterday (08/22/13) in the ICU  Continue current care. Plan D/C tomorrow  Melvern Banker 08/23/2013, 8:06 AM I have seen and examined this patient and agree the above assessment. Joaquim Lai Cresenzo-Dishmon 08/23/2013 9:00 PM

## 2013-08-24 MED ORDER — OXYCODONE HCL 5 MG PO TABS: 5.0000 mg | ORAL_TABLET | ORAL | Status: DC | PRN

## 2013-08-24 MED ORDER — HYDROCHLOROTHIAZIDE 12.5 MG PO CAPS: 12.5000 mg | ORAL_CAPSULE | Freq: Every day | ORAL | Status: DC

## 2013-08-24 NOTE — Discharge Summary (Signed)
Obstetric Discharge Summary Reason for Admission: pre-eclampsia, patient admitted for headache and blurry vision, found to have p/c ratio of 0.5. Admitted to antenatal, then taken for c-section when headache did not improve with medication. Prenatal Procedures: magnesium Intrapartum Procedures: cesarean: low cervical, transverse Postpartum Procedures: magnesium for postpartum preeclampsia Complications-Operative and Postpartum: postpartum preeclampsia. Hemoglobin  Date Value Ref Range Status  08/22/2013 8.8* 12.0 - 15.0 g/dL Final     DELTA CHECK NOTED     REPEATED TO VERIFY     HCT  Date Value Ref Range Status  08/22/2013 27.0* 36.0 - 46.0 % Final    Physical Exam:  General: alert Lochia: appropriate Uterine Fundus: firm Incision: wound vac in place, c/d/i DVT Evaluation: No evidence of DVT seen on physical exam.  Discharge Diagnoses: Preelampsia and preterm pregnancy delivered  Discharge Information: Date: 08/24/2013 Activity: pelvic rest Diet: routine Medications: oxycodone Condition: stable Instructions: refer to practice specific booklet Discharge to: home   Newborn Data: Live born female  Birth Weight: 6 lb 11.1 oz (3035 g) APGAR: 7, 9  Home with mother.  Leone Haven 08/24/2013, 7:43 AM I have seen the patient with the resident/student and agree with the above.  Mathis Bud

## 2013-08-24 NOTE — Discharge Instructions (Signed)
Postpartum Care After Cesarean Delivery °After you deliver your newborn (postpartum period), the usual stay in the hospital is 24 72 hours. If there were problems with your labor or delivery, or if you have other medical problems, you might be in the hospital longer.  °While you are in the hospital, you will receive help and instructions on how to care for yourself and your newborn during the postpartum period.  °While you are in the hospital: °· It is normal for you to have pain or discomfort from the incision in your abdomen. Be sure to tell your nurses when you are having pain, where the pain is located, and what makes the pain worse. °· If you are breastfeeding, you may feel uncomfortable contractions of your uterus for a couple of weeks. This is normal. The contractions help your uterus get back to normal size. °· It is normal to have some bleeding after delivery. °· For the first 1 3 days after delivery, the flow is red and the amount may be similar to a period. °· It is common for the flow to start and stop. °· In the first few days, you may pass some small clots. Let your nurses know if you begin to pass large clots or your flow increases. °· Do not  flush blood clots down the toilet before having the nurse look at them. °· During the next 3 10 days after delivery, your flow should become more watery and pink or brown-tinged in color. °· Ten to fourteen days after delivery, your flow should be a small amount of yellowish-white discharge. °· The amount of your flow will decrease over the first few weeks after delivery. Your flow may stop in 6 8 weeks. Most women have had their flow stop by 12 weeks after delivery. °· You should change your sanitary pads frequently. °· Wash your hands thoroughly with soap and water for at least 20 seconds after changing pads, using the toilet, or before holding or feeding your newborn. °· Your intravenous (IV) tubing will be removed when you are drinking enough fluids. °· The  urine drainage tube (urinary catheter) that was inserted before delivery may be removed within 6 8 hours after delivery or when feeling returns to your legs. You should feel like you need to empty your bladder within the first 6 8 hours after the catheter has been removed. °· In case you become weak, lightheaded, or faint, call your nurse before you get out of bed for the first time and before you take a shower for the first time. °· Within the first few days after delivery, your breasts may begin to feel tender and full. This is called engorgement. Breast tenderness usually goes away within 48 72 hours after engorgement occurs. You may also notice milk leaking from your breasts. If you are not breastfeeding, do not stimulate your breasts. Breast stimulation can make your breasts produce more milk. °· Spending as much time as possible with your newborn is very important. During this time, you and your newborn can feel close and get to know each other. Having your newborn stay in your room (rooming in) will help to strengthen the bond with your newborn. It will give you time to get to know your newborn and become comfortable caring for your newborn. °· Your hormones change after delivery. Sometimes the hormone changes can temporarily cause you to feel sad or tearful. These feelings should not last more than a few days. If these feelings last longer   than that, you should talk to your caregiver. °· If desired, talk to your caregiver about methods of family planning or contraception. °· Talk to your caregiver about immunizations. Your caregiver may want you to have the following immunizations before leaving the hospital: °· Tetanus, diphtheria, and pertussis (Tdap) or tetanus and diphtheria (Td) immunization. It is very important that you and your family (including grandparents) or others caring for your newborn are up-to-date with the Tdap or Td immunizations. The Tdap or Td immunization can help protect your newborn  from getting ill. °· Rubella immunization. °· Varicella (chickenpox) immunization. °· Influenza immunization. You should receive this annual immunization if you did not receive the immunization during your pregnancy. °Document Released: 12/14/2011 Document Reviewed: 12/14/2011 °[employer]® Patient Information ©2014 [employer]. ° °

## 2013-08-24 NOTE — Progress Notes (Signed)
Pt concerned that present PRN pain medication is "not keeping my pain under control".  Also states that her feet seem to be getting bigger and painful. Pt educated on importance of keeping feet elevated while in bed/chair. Vitals signs WNL. Pain medication given as requested and MD notified with present complaint. MD States he will be around to see patient during morning rounds.

## 2013-08-24 NOTE — Lactation Note (Addendum)
This note was copied from the chart of [place]. Lactation Consultation Note    Follow up consult with this mom and baby, now 12 hours post partum, 36 5/7 weeks corrected gestation, weight today 6lbs 2.2 oz. Late pre term teaching reviewed with parents. Pre and post weight done, with 24 nipple shield applied to left breast due to flat nipples. The baby transferred 5 mls at this breast, in 15 minutes. I then added SNS to 24 shield, but it was leaking, so we tried a 20 nipple shield, which fit better, and stimulated baby to suck and transfer an additional 30 mls of EBM, for a total of 35 mls in about 40 minutes. The extended time was due to teaching, and I feel he will quickly drink the SNS EBM , when sytem is set up well with the first try. I advised mom to not stress about using this, since the baby is already sucking from a nipple shape with the shilel. If busy and/or overwhelmed, she can pump and bottle feed baby, as long as baby is feeding and her milk supply is protected. Mom will call for o/p lactation appointment, after seeing pediatrician on 5/26.   Patient Name: Olivia Moon [id number] Date: 08/24/2013 Reason for consult: Follow-up assessment;Late preterm infant   Maternal Data    Feeding Feeding Type: Breast Fed Length of feed: 40 min  LATCH Score/Interventions Latch: Repeated attempts needed to sustain latch, nipple held in mouth throughout feeding, stimulation needed to elicit sucking reflex. Intervention(s): Adjust position;Assist with latch;Breast massage;Breast compression  Audible Swallowing: A few with stimulation (with SNS) Intervention(s): Skin to skin;Hand expression  Type of Nipple: Flat (24 nipple shiled too big with SNS, 20 worked well) Intervention(s): Hand pump;Double electric pump  Comfort (Breast/Nipple): Filling, red/small blisters or bruises, mild/mod discomfort  Problem noted: Mild/Moderate discomfort (EBM to nipples) Interventions (Mild/moderate  discomfort): Comfort gels  Hold (Positioning): Assistance needed to correctly position infant at breast and maintain latch. Intervention(s): Breastfeeding basics reviewed;Support Pillows;Position options;Skin to skin  LATCH Score: 5  Lactation Tools Discussed/Used Tools: Nipple Shields Nipple shield size: 20 Pump Review: Setup, frequency, and cleaning;Milk Storage;Other (comment) (mom and dad shown how to use SNS system, and clean and assemble )   Consult Status Consult Status: Follow-up Follow-up type: Call as needed (baby has appointmetn with pediatrician on Tues, 6/1, and after this mom wil call for o/p lactation consult)     Ped appointment no 6/1 but 5/26     Tonna Corner 08/24/2013, 10:27 AM

## 2013-08-25 NOTE — Discharge Summary (Signed)
Attestation of Attending Supervision of Advanced Practitioner (CNM/NP): Evaluation and management procedures were performed by the Advanced Practitioner under my supervision and collaboration.  I have reviewed the Advanced Practitioner's note and chart, and I agree with the management and plan.  Hoyle Sauer Harraway-Smith 9:47 AM

## 2013-09-03 ENCOUNTER — Ambulatory Visit (HOSPITAL_COMMUNITY)
Admission: RE | Admit: 2013-09-03 | Discharge: 2013-09-03 | Disposition: A | Discharge status: Home / Self Care | Payer: 59 | Source: Ambulatory Visit | Attending: Obstetrics & Gynecology | Admitting: Obstetrics & Gynecology

## 2013-09-03 NOTE — Lactation Note (Addendum)
Infant Lactation Consultation Outpatient Visit Note  Patient Name: Olivia Moon Date of Birth: [birth year] Birth Weight:  6+11 Gestational Age at Delivery: Gestational Age: <None> Type of Delivery: c-section Breastfeeding History Frequency of Breastfeeding: 7 times in 24 hours Length of Feeding: 15-20 minutes Voids: 6+ Stools: 3+  Supplementing / Method: Pumping:  Type of Pump:PIS   Frequency:post pump3 times in 24 hours  Volume:  6-8 ounces  Comments:    Consultation Evaluation:  Initial Feeding Assessment: Pre-feed [id number] Post-feed [id number] Amount Transferred:52 Comments: Mom is trying to latch [name] with the nipple shield.  He would not latch to the bare breast.  NS was applied. He latched and transferred 52 ml.   Explained off center latch to mom and she understood how to "roll" him onto the breast. She will try to use this technique at home and follow up on June 19th.  It was noted that the upper lingual frenum was inserted beyond the alveolar ridge.  His lingual frenum was inserted about 50mm from the tip and it was cupping with elevation.  He did not maintain a vacuum on my gloved finger but he did well with the NS.   :  Total Breast milk Transferred this Visit:  Total Supplement Given:   Additional Interventions:   Follow-Up      Van Clines 09/03/2013, 9:21 AM

## 2013-09-18 ENCOUNTER — Ambulatory Visit: Payer: 59 | Admitting: Obstetrics & Gynecology

## 2013-09-20 ENCOUNTER — Ambulatory Visit (INDEPENDENT_AMBULATORY_CARE_PROVIDER_SITE_OTHER): Payer: 59 | Admitting: Advanced Practice Midwife

## 2013-09-20 ENCOUNTER — Encounter: Payer: Self-pay | Admitting: Advanced Practice Midwife

## 2013-09-20 ENCOUNTER — Ambulatory Visit (HOSPITAL_COMMUNITY): Payer: 59

## 2013-09-20 VITALS — BP 124/74 | HR 82 | Resp 16 | Ht 63.0 in | Wt 230.0 lb

## 2013-09-20 DIAGNOSIS — Z09 Encounter for follow-up examination after completed treatment for conditions other than malignant neoplasm: Secondary | ICD-10-CM

## 2013-09-20 DIAGNOSIS — Z98891 History of uterine scar from previous surgery: Secondary | ICD-10-CM

## 2013-09-20 NOTE — Patient Instructions (Signed)

## 2013-09-20 NOTE — Progress Notes (Signed)
  Subjective:     Olivia Moon is a 33 y.o. female who presents for a postpartum visit. She is 4 weeks postpartum following a low cervical transverse Cesarean section for breech/failed version. I have fully reviewed the prenatal and intrapartum course. The delivery was at term   Outcome: primary cesarean section, low transverse incision. Anesthesia: regional. Postpartum course has been unevetnful. Baby's course has been uneventful. Baby is feeding by breast. Bleeding no bleeding. Bowel function is normal. Bladder function is normal. Patient is not sexually active. Contraception method is condoms and will use NFP when periods resume. Postpartum depression screening: negative,score = 1.  The following portions of the patient's history were reviewed and updated as appropriate: allergies, current medications, past family history, past medical history, past social history, past surgical history and problem list.  Review of Systems Pertinent items are noted in HPI.   Objective:    BP 124/74  Pulse 82  Resp 16  Ht 5\' 3"  (1.6 m)  Wt 104.327 kg (230 lb)  BMI 40.75 kg/m2  Breastfeeding? Yes  General:  alert and no distress   Breasts:  inspection negative, no nipple discharge or bleeding, no masses or nodularity palpable  Lungs: clear to auscultation bilaterally  Heart:  regular rate and rhythm, S1, S2 normal, no murmur, click, rub or gallop  Abdomen: soft, non-tender; bowel sounds normal; no masses,  no organomegaly  Incision well healed   Vulva:  not evaluated  Vagina: not evaluated  Cervix:  n/a  Corpus: not examined  Adnexa:  not evaluated  Rectal Exam: Not performed.        Assessment:     Normal postoperative / postpartum exam. Pap smear not done at today's visit.   Plan:    1. Contraception: condoms 2. Discussed NFP cannot be used until periods resume. Can get pregnant. Pt states will use condoms 3. Follow up in: 1 year or as needed.

## 2014-02-03 ENCOUNTER — Encounter: Payer: Self-pay | Admitting: Advanced Practice Midwife

## 2014-04-04 NOTE — L&D Delivery Note (Signed)
Patient is 34 y.o. [id number] [redacted]w[redacted]d admitted for IOL 2/2 preEclampsia with severe features, hx of of cesarean section 2/2 breech presentation   Delivery Note At 11:10 PM a viable female was delivered via Vaginal, Spontaneous Delivery (Presentation: ;  ).  APGAR: , ; weight 5 lb 15.6 oz (2710 g).   Placenta status: Intact, Spontaneous.  Cord:  with the following complications:   Anesthesia:  epidural Episiotomy:  none Lacerations:  Small 1st degree Suture Repair: 3.0 vicryl rapide with figure of eight Est. Blood Loss (mL):  428mL  Mom to postpartum.  Baby to Couplet care / Skin to Skin initially, then to warmer to be evaluated by NICU, subsequently to NICU secondary to Edgar.  1050mcg cytotec ppx given PR secondary to long labor  Olivia Moon ROCIO 09/13/2014, 11:41 PM

## 2014-04-11 ENCOUNTER — Encounter: Payer: Self-pay | Admitting: Advanced Practice Midwife

## 2014-04-11 ENCOUNTER — Other Ambulatory Visit: Payer: Self-pay | Admitting: Advanced Practice Midwife

## 2014-04-11 ENCOUNTER — Ambulatory Visit (INDEPENDENT_AMBULATORY_CARE_PROVIDER_SITE_OTHER): Payer: 59 | Admitting: Advanced Practice Midwife

## 2014-04-11 VITALS — BP 130/81 | HR 85 | Wt 228.0 lb

## 2014-04-11 DIAGNOSIS — Z113 Encounter for screening for infections with a predominantly sexual mode of transmission: Secondary | ICD-10-CM

## 2014-04-11 DIAGNOSIS — Z8279 Family history of other congenital malformations, deformations and chromosomal abnormalities: Secondary | ICD-10-CM | POA: Insufficient documentation

## 2014-04-11 DIAGNOSIS — Z124 Encounter for screening for malignant neoplasm of cervix: Secondary | ICD-10-CM

## 2014-04-11 DIAGNOSIS — Z8632 Personal history of gestational diabetes: Secondary | ICD-10-CM

## 2014-04-11 DIAGNOSIS — O09299 Supervision of pregnancy with other poor reproductive or obstetric history, unspecified trimester: Secondary | ICD-10-CM | POA: Insufficient documentation

## 2014-04-11 DIAGNOSIS — Z9889 Other specified postprocedural states: Secondary | ICD-10-CM

## 2014-04-11 DIAGNOSIS — O09891 Supervision of other high risk pregnancies, first trimester: Secondary | ICD-10-CM

## 2014-04-11 DIAGNOSIS — Z98891 History of uterine scar from previous surgery: Secondary | ICD-10-CM

## 2014-04-11 DIAGNOSIS — O09892 Supervision of other high risk pregnancies, second trimester: Secondary | ICD-10-CM | POA: Insufficient documentation

## 2014-04-11 DIAGNOSIS — O09292 Supervision of pregnancy with other poor reproductive or obstetric history, second trimester: Secondary | ICD-10-CM

## 2014-04-11 DIAGNOSIS — Z1151 Encounter for screening for human papillomavirus (HPV): Secondary | ICD-10-CM

## 2014-04-11 DIAGNOSIS — Z3682 Encounter for antenatal screening for nuchal translucency: Secondary | ICD-10-CM

## 2014-04-11 NOTE — Patient Instructions (Signed)
First Trimester of Pregnancy The first trimester of pregnancy is from week 1 until the end of week 12 (months 1 through 3). A week after a sperm fertilizes an egg, the egg will implant on the wall of the uterus. This embryo will begin to develop into a baby. Genes from you and your partner are forming the baby. The female genes determine whether the baby is a boy or a girl. At 6-8 weeks, the eyes and face are formed, and the heartbeat can be seen on ultrasound. At the end of 12 weeks, all the baby's organs are formed.  Now that you are pregnant, you will want to do everything you can to have a healthy baby. Two of the most important things are to get good prenatal care and to follow your health care provider's instructions. Prenatal care is all the medical care you receive before the baby's birth. This care will help prevent, find, and treat any problems during the pregnancy and childbirth. BODY CHANGES Your body goes through many changes during pregnancy. The changes vary from woman to woman.   You may gain or lose a couple of pounds at first.  You may feel sick to your stomach (nauseous) and throw up (vomit). If the vomiting is uncontrollable, call your health care provider.  You may tire easily.  You may develop headaches that can be relieved by medicines approved by your health care provider.  You may urinate more often. Painful urination may mean you have a bladder infection.  You may develop heartburn as a result of your pregnancy.  You may develop constipation because certain hormones are causing the muscles that push waste through your intestines to slow down.  You may develop hemorrhoids or swollen, bulging veins (varicose veins).  Your breasts may begin to grow larger and become tender. Your nipples may stick out more, and the tissue that surrounds them (areola) may become darker.  Your gums may bleed and may be sensitive to brushing and flossing.  Dark spots or blotches (chloasma,  mask of pregnancy) may develop on your face. This will likely fade after the baby is born.  Your menstrual periods will stop.  You may have a loss of appetite.  You may develop cravings for certain kinds of food.  You may have changes in your emotions from day to day, such as being excited to be pregnant or being concerned that something may go wrong with the pregnancy and baby.  You may have more vivid and strange dreams.  You may have changes in your hair. These can include thickening of your hair, rapid growth, and changes in texture. Some women also have hair loss during or after pregnancy, or hair that feels dry or thin. Your hair will most likely return to normal after your baby is born. WHAT TO EXPECT AT YOUR PRENATAL VISITS During a routine prenatal visit:  You will be weighed to make sure you and the baby are growing normally.  Your blood pressure will be taken.  Your abdomen will be measured to track your baby's growth.  The fetal heartbeat will be listened to starting around week 10 or 12 of your pregnancy.  Test results from any previous visits will be discussed. Your health care provider may ask you:  How you are feeling.  If you are feeling the baby move.  If you have had any abnormal symptoms, such as leaking fluid, bleeding, severe headaches, or abdominal cramping.  If you have any questions. Other tests   that may be performed during your first trimester include:  Blood tests to find your blood type and to check for the presence of any previous infections. They will also be used to check for low iron levels (anemia) and Rh antibodies. Later in the pregnancy, blood tests for diabetes will be done along with other tests if problems develop.  Urine tests to check for infections, diabetes, or protein in the urine.  An ultrasound to confirm the proper growth and development of the baby.  An amniocentesis to check for possible genetic problems.  Fetal screens for  spina bifida and Down syndrome.  You may need other tests to make sure you and the baby are doing well. HOME CARE INSTRUCTIONS  Medicines  Follow your health care provider's instructions regarding medicine use. Specific medicines may be either safe or unsafe to take during pregnancy.  Take your prenatal vitamins as directed.  If you develop constipation, try taking a stool softener if your health care provider approves. Diet  Eat regular, well-balanced meals. Choose a variety of foods, such as meat or vegetable-based protein, fish, milk and low-fat dairy products, vegetables, fruits, and whole grain breads and cereals. Your health care provider will help you determine the amount of weight gain that is right for you.  Avoid raw meat and uncooked cheese. These carry germs that can cause birth defects in the baby.  Eating four or five small meals rather than three large meals a day may help relieve nausea and vomiting. If you start to feel nauseous, eating a few soda crackers can be helpful. Drinking liquids between meals instead of during meals also seems to help nausea and vomiting.  If you develop constipation, eat more high-fiber foods, such as fresh vegetables or fruit and whole grains. Drink enough fluids to keep your urine clear or pale yellow. Activity and Exercise  Exercise only as directed by your health care provider. Exercising will help you:  Control your weight.  Stay in shape.  Be prepared for labor and delivery.  Experiencing pain or cramping in the lower abdomen or low back is a good sign that you should stop exercising. Check with your health care provider before continuing normal exercises.  Try to avoid standing for long periods of time. Move your legs often if you must stand in one place for a long time.  Avoid heavy lifting.  Wear low-heeled shoes, and practice good posture.  You may continue to have sex unless your health care provider directs you  otherwise. Relief of Pain or Discomfort  Wear a good support bra for breast tenderness.   Take warm sitz baths to soothe any pain or discomfort caused by hemorrhoids. Use hemorrhoid cream if your health care provider approves.   Rest with your legs elevated if you have leg cramps or low back pain.  If you develop varicose veins in your legs, wear support hose. Elevate your feet for 15 minutes, 3-4 times a day. Limit salt in your diet. Prenatal Care  Schedule your prenatal visits by the twelfth week of pregnancy. They are usually scheduled monthly at first, then more often in the last 2 months before delivery.  Write down your questions. Take them to your prenatal visits.  Keep all your prenatal visits as directed by your health care provider. Safety  Wear your seat belt at all times when driving.  Make a list of emergency phone numbers, including numbers for family, friends, the hospital, and police and fire departments. General Tips    Ask your health care provider for a referral to a local prenatal education class. Begin classes no later than at the beginning of month 6 of your pregnancy.  Ask for help if you have counseling or nutritional needs during pregnancy. Your health care provider can offer advice or refer you to specialists for help with various needs.  Do not use hot tubs, steam rooms, or saunas.  Do not douche or use tampons or scented sanitary pads.  Do not cross your legs for long periods of time.  Avoid cat litter boxes and soil used by cats. These carry germs that can cause birth defects in the baby and possibly loss of the fetus by miscarriage or stillbirth.  Avoid all smoking, herbs, alcohol, and medicines not prescribed by your health care provider. Chemicals in these affect the formation and growth of the baby.  Schedule a dentist appointment. At home, brush your teeth with a soft toothbrush and be gentle when you floss. SEEK MEDICAL CARE IF:   You have  dizziness.  You have mild pelvic cramps, pelvic pressure, or nagging pain in the abdominal area.  You have persistent nausea, vomiting, or diarrhea.  You have a bad smelling vaginal discharge.  You have pain with urination.  You notice increased swelling in your face, hands, legs, or ankles. SEEK IMMEDIATE MEDICAL CARE IF:   You have a fever.  You are leaking fluid from your vagina.  You have spotting or bleeding from your vagina.  You have severe abdominal cramping or pain.  You have rapid weight gain or loss.  You vomit blood or material that looks like coffee grounds.  You are exposed to German measles and have never had them.  You are exposed to fifth disease or chickenpox.  You develop a severe headache.  You have shortness of breath.  You have any kind of trauma, such as from a fall or a car accident. Document Released: 03/15/2001 Document Revised: 08/05/2013 Document Reviewed: 01/29/2013 ExitCare Patient Information 2015 [employer]. This information is not intended to replace advice given to you by your health care provider. Make sure you discuss any questions you have with your health care provider.  

## 2014-04-11 NOTE — Progress Notes (Signed)
New OB   See smart set   Subjective:    Olivia Moon is a [id number] [redacted]w[redacted]d being seen today for her first obstetrical visit.  Her obstetrical history is significant for obesity and Hx of preterm labor, hx C/S for breech and preeclampsia, hx preeclampsia, Fam Hx cong heart defect.And closely spaced pregnancy.   Patient does intend to breast feed. Pregnancy history fully reviewed.  Patient reports no complaints.  Filed Vitals:   04/11/14 0859  BP: 130/81  Pulse: 85  Weight: 228 lb (103.42 kg)    HISTORY: OB History  Gravida Para Term Preterm AB SAB TAB Ectopic Multiple Living  5 3 1 2 1 1    3     # Outcome Date GA Lbr Len/2nd Weight Sex Delivery Anes PTL Lv  5 Current           4 Preterm 08/21/13 [redacted]w[redacted]d   M CS-LTranv Spinal  Y  3 SAB 2009 [redacted]w[redacted]d         2 Term 2005 [redacted]w[redacted]d  7 lb 8 oz (3.402 kg)  Vag-Spont EPI  Y  1 Preterm 2004 [redacted]w[redacted]d  4 lb 12 oz (2.155 kg) F Vag-Spont   Y     Past Medical History  Diagnosis Date  . Gestational diabetes mellitus, antepartum   . Preeclampsia     "with first pregnancy"  . Gestational diabetes    Past Surgical History  Procedure Laterality Date  . None    . No past surgeries    . Cesarean section N/A 08/21/2013    Procedure: CESAREAN SECTION;  Surgeon: Woodroe Mode, MD;  Location: [facility];  Service: Obstetrics;  Laterality: N/A;   Family History  Problem Relation Age of Onset  . Diabetes Mother   . Breast cancer Maternal Grandmother   . Hypertension Mother   . Breast cancer Maternal Aunt   . Hypertension Maternal Grandmother   . Hypertension Sister   . Diabetes Sister   . Heart disease Father   . Rheumatic fever Father   . Heart disease Paternal Grandfather      Exam    Uterus:  Fundal Height: 12 cm  Pelvic Exam:    Perineum: No Hemorrhoids, Normal Perineum   Vulva: Bartholin's, Urethra, Skene's normal   Vagina:  normal mucosa, normal discharge   pH:    Cervix: multiparous appearance   Adnexa: normal adnexa and no mass,  fullness, tenderness   Bony Pelvis: gynecoid  System: Breast:  normal appearance, no masses or tenderness   Skin: normal coloration and turgor, no rashes    Neurologic: oriented, grossly non-focal   Extremities: normal strength, tone, and muscle mass   HEENT neck supple with midline trachea   Mouth/Teeth mucous membranes moist, pharynx normal without lesions   Neck supple and no masses   Cardiovascular: regular rate and rhythm   Respiratory:  appears well, vitals normal, no respiratory distress, acyanotic, normal RR, ear and throat exam is normal, neck free of mass or lymphadenopathy, chest clear, no wheezing, crepitations, rhonchi, normal symmetric air entry   Abdomen: soft, non-tender; bowel sounds normal; no masses,  no organomegaly   Urinary: urethral meatus normal      Assessment:    Pregnancy: [id number] Patient Active Problem List   Diagnosis Date Noted  . Short interval between pregnancies affecting pregnancy in second trimester, antepartum 04/11/2014  . Hx of preeclampsia, prior pregnancy, currently pregnant 04/11/2014  . Family history of congenital heart defect 04/11/2014  . S/P C-section 08/21/2013  .  Breech presentation, antepartum 07/22/2013  . Gestational diabetes mellitus, antepartum 06/27/2013  . Supervision of high-risk pregnancy 06/03/2013  . Pregnancy complicated by previous preterm labor in first trimester 01/23/2013  . Obesity (BMI 30-39.9) 01/23/2013        Plan:     Initial labs drawn. Prenatal vitamins. Problem list reviewed and updated. Genetic Screening discussed Quad Screen: requested.  Ultrasound discussed; fetal survey: ordered.  Follow up in 4 weeks. 50% of 30 min visit spent on counseling and coordination of care.  Baseline PIH labs done. Will need fetal echo  Wants TOLAC.   [facility] 04/11/2014

## 2014-04-12 LAB — HIV ANTIBODY (ROUTINE TESTING W REFLEX): HIV 1&2 Ab, 4th Generation: NONREACTIVE

## 2014-04-12 LAB — PROTEIN / CREATININE RATIO, URINE
CREATININE, URINE: 234.5 mg/dL
PROTEIN CREATININE RATIO: 0.1 (ref <0.15)
TOTAL PROTEIN, URINE: 23 mg/dL (ref 5–24)

## 2014-04-13 LAB — CULTURE, URINE COMPREHENSIVE
Colony Count: NO GROWTH
Organism ID, Bacteria: NO GROWTH

## 2014-04-14 LAB — OBSTETRIC PANEL
Antibody Screen: NEGATIVE
Basophils Absolute: 0 10*3/uL (ref 0.0–0.1)
Basophils Relative: 0 % (ref 0–1)
Eosinophils Absolute: 0.2 10*3/uL (ref 0.0–0.7)
Eosinophils Relative: 2 % (ref 0–5)
HCT: 33.4 % — ABNORMAL LOW (ref 36.0–46.0)
HEMOGLOBIN: 11.4 g/dL — AB (ref 12.0–15.0)
Hepatitis B Surface Ag: NEGATIVE
LYMPHS ABS: 1.9 10*3/uL (ref 0.7–4.0)
Lymphocytes Relative: 24 % (ref 12–46)
MCH: 30.6 pg (ref 26.0–34.0)
MCHC: 34.1 g/dL (ref 30.0–36.0)
MCV: 89.5 fL (ref 78.0–100.0)
MONO ABS: 0.5 10*3/uL (ref 0.1–1.0)
MONOS PCT: 6 % (ref 3–12)
MPV: 10.1 fL (ref 8.6–12.4)
NEUTROS ABS: 5.4 10*3/uL (ref 1.7–7.7)
Neutrophils Relative %: 68 % (ref 43–77)
PLATELETS: 236 10*3/uL (ref 150–400)
RBC: 3.73 MIL/uL — ABNORMAL LOW (ref 3.87–5.11)
RDW: 14.4 % (ref 11.5–15.5)
Rh Type: POSITIVE
Rubella: 0.86 Index (ref <0.90)
WBC: 7.9 10*3/uL (ref 4.0–10.5)

## 2014-04-15 LAB — CYTOLOGY - PAP

## 2014-04-15 LAB — GLUCOSE TOLERANCE, 1 HOUR (50G) W/O FASTING: Glucose, 1 Hour GTT: 71 mg/dL (ref 70–140)

## 2014-04-17 ENCOUNTER — Ambulatory Visit (HOSPITAL_COMMUNITY)
Admission: RE | Admit: 2014-04-17 | Discharge: 2014-04-17 | Disposition: A | Discharge status: Home / Self Care | Payer: 59 | Source: Ambulatory Visit | Attending: Advanced Practice Midwife | Admitting: Advanced Practice Midwife

## 2014-04-17 ENCOUNTER — Encounter (HOSPITAL_COMMUNITY): Payer: Self-pay

## 2014-04-17 DIAGNOSIS — O3421 Maternal care for scar from previous cesarean delivery: Secondary | ICD-10-CM | POA: Diagnosis not present

## 2014-04-17 DIAGNOSIS — O09211 Supervision of pregnancy with history of pre-term labor, first trimester: Secondary | ICD-10-CM | POA: Diagnosis not present

## 2014-04-17 DIAGNOSIS — Z3A13 13 weeks gestation of pregnancy: Secondary | ICD-10-CM | POA: Diagnosis not present

## 2014-04-17 DIAGNOSIS — Z3682 Encounter for antenatal screening for nuchal translucency: Secondary | ICD-10-CM

## 2014-04-17 DIAGNOSIS — Z36 Encounter for antenatal screening of mother: Secondary | ICD-10-CM | POA: Diagnosis not present

## 2014-04-17 DIAGNOSIS — Z8279 Family history of other congenital malformations, deformations and chromosomal abnormalities: Secondary | ICD-10-CM

## 2014-04-17 DIAGNOSIS — O09521 Supervision of elderly multigravida, first trimester: Secondary | ICD-10-CM | POA: Insufficient documentation

## 2014-04-17 DIAGNOSIS — O09291 Supervision of pregnancy with other poor reproductive or obstetric history, first trimester: Secondary | ICD-10-CM | POA: Diagnosis not present

## 2014-04-28 ENCOUNTER — Telehealth (HOSPITAL_COMMUNITY): Payer: Self-pay | Admitting: MS"

## 2014-04-28 ENCOUNTER — Other Ambulatory Visit (HOSPITAL_COMMUNITY): Payer: Self-pay

## 2014-04-28 NOTE — Telephone Encounter (Signed)
Called Olivia Moon to discuss her cell free fetal DNA test results.  Olivia Moon had Panorama testing through [facility].  Testing was offered because of maternal age and ultrasound findings.   The patient was identified by name and DOB.  We reviewed that these are within normal limits, showing a less than 1 in 10,000 risk for trisomies 21, 18 and 13, and monosomy X (Turner syndrome).  In addition, the risk for triploidy/vanishing twin and sex chromosome trisomies (47,XXX and 47,XXY) was also low risk. We reviewed that this testing identifies > 99% of pregnancies with trisomy 77, trisomy 22, sex chromosome trisomies (47,XXX and 47,XXY), and triploidy. The detection rate for trisomy 18 is 96%.  The detection rate for monosomy X is ~92%.  The false positive rate is <0.1% for all conditions. Testing was also consistent with female fetal sex.  The patient did wish to know fetal sex.  She understands that this testing does not identify all genetic conditions.  All questions were answered to her satisfaction, she was encouraged to call with additional questions or concerns.  Chipper Oman, MS Certified Genetic Counselor 04/28/2014 9:18 AM

## 2014-05-09 ENCOUNTER — Ambulatory Visit (INDEPENDENT_AMBULATORY_CARE_PROVIDER_SITE_OTHER): Payer: 59 | Admitting: Obstetrics & Gynecology

## 2014-05-09 VITALS — BP 125/82 | HR 95 | Wt 226.0 lb

## 2014-05-09 DIAGNOSIS — Z8751 Personal history of pre-term labor: Secondary | ICD-10-CM

## 2014-05-09 DIAGNOSIS — O0992 Supervision of high risk pregnancy, unspecified, second trimester: Secondary | ICD-10-CM

## 2014-05-09 DIAGNOSIS — O09892 Supervision of other high risk pregnancies, second trimester: Secondary | ICD-10-CM

## 2014-05-09 DIAGNOSIS — Z36 Encounter for antenatal screening of mother: Secondary | ICD-10-CM

## 2014-05-09 DIAGNOSIS — Z23 Encounter for immunization: Secondary | ICD-10-CM

## 2014-05-09 MED ORDER — INFLUENZA VAC SPLIT QUAD 0.5 ML IM SUSY
0.5000 mL | PREFILLED_SYRINGE | Freq: Once | INTRAMUSCULAR | Status: AC
  Administered 2014-05-09: 0.5 mL via INTRAMUSCULAR

## 2014-05-09 NOTE — Progress Notes (Signed)
Flu shot, fetal echo, AFP reordered today.  Has anatomy scan appt (2/18);   Needs baseline 24 hour urine.  (had protein creatinine ration but will go with gold standard.   Early GCT today.

## 2014-05-09 NOTE — Addendum Note (Signed)
Addended by: Asencion Islam on: 05/09/2014 10:54 AM   Modules accepted: Orders

## 2014-05-10 LAB — GLUCOSE TOLERANCE, 1 HOUR (50G) W/O FASTING: Glucose, 1 Hour GTT: 176 mg/dL — ABNORMAL HIGH (ref 70–140)

## 2014-05-12 ENCOUNTER — Encounter: Payer: Self-pay | Admitting: Advanced Practice Midwife

## 2014-05-12 ENCOUNTER — Telehealth: Payer: Self-pay | Admitting: *Deleted

## 2014-05-12 DIAGNOSIS — IMO0002 Reserved for concepts with insufficient information to code with codable children: Secondary | ICD-10-CM | POA: Insufficient documentation

## 2014-05-12 DIAGNOSIS — O351XX0 Maternal care for (suspected) chromosomal abnormality in fetus, not applicable or unspecified: Secondary | ICD-10-CM | POA: Insufficient documentation

## 2014-05-12 LAB — ALPHA FETOPROTEIN, MATERNAL
AFP: 16 ng/mL
Curr Gest Age: 16.3 wks.days
MoM for AFP: 0.59
OPEN SPINA BIFIDA: NEGATIVE
Osb Risk: <1:27300 {titer}

## 2014-05-12 LAB — PROTEIN, URINE, 24 HOUR

## 2014-05-12 NOTE — Telephone Encounter (Signed)
LM on vociemail of failed 1 hr GTT and needs to schedule a 3 hr GTT

## 2014-05-14 ENCOUNTER — Other Ambulatory Visit: Payer: Self-pay | Admitting: Advanced Practice Midwife

## 2014-05-14 DIAGNOSIS — O283 Abnormal ultrasonic finding on antenatal screening of mother: Secondary | ICD-10-CM

## 2014-05-14 DIAGNOSIS — Z3689 Encounter for other specified antenatal screening: Secondary | ICD-10-CM

## 2014-05-14 DIAGNOSIS — Z3A18 18 weeks gestation of pregnancy: Secondary | ICD-10-CM

## 2014-05-16 ENCOUNTER — Other Ambulatory Visit: Payer: 59 | Admitting: *Deleted

## 2014-05-16 DIAGNOSIS — O0992 Supervision of high risk pregnancy, unspecified, second trimester: Secondary | ICD-10-CM

## 2014-05-16 NOTE — Progress Notes (Signed)
Pt here for 24 hour urine labs and 3 hr gtt labs - pt will report to lab for completion.

## 2014-05-17 LAB — COMPREHENSIVE METABOLIC PANEL
ALBUMIN: 3.7 g/dL (ref 3.5–5.2)
ALT: 13 U/L (ref 0–35)
AST: 15 U/L (ref 0–37)
Alkaline Phosphatase: 45 U/L (ref 39–117)
BUN: 7 mg/dL (ref 6–23)
CO2: 23 mEq/L (ref 19–32)
Calcium: 8.9 mg/dL (ref 8.4–10.5)
Chloride: 104 mEq/L (ref 96–112)
Creat: 0.6 mg/dL (ref 0.50–1.10)
GLUCOSE: 75 mg/dL (ref 70–99)
Potassium: 4 mEq/L (ref 3.5–5.3)
SODIUM: 136 meq/L (ref 135–145)
TOTAL PROTEIN: 6.6 g/dL (ref 6.0–8.3)
Total Bilirubin: 0.3 mg/dL (ref 0.2–1.2)

## 2014-05-17 LAB — PROTEIN, URINE, 24 HOUR
PROTEIN 24H UR: 112 mg/d (ref <150)
PROTEIN, URINE: 16 mg/dL (ref 5–24)

## 2014-05-17 LAB — GLUCOSE TOLERANCE, 3 HOURS
Glucose Tolerance, 1 hour: 157 mg/dL (ref 70–189)
Glucose Tolerance, 2 hour: 155 mg/dL (ref 70–164)
Glucose Tolerance, Fasting: 76 mg/dL (ref 70–104)
Glucose, GTT - 3 Hour: 64 mg/dL — ABNORMAL LOW (ref 70–144)

## 2014-05-17 LAB — CREATININE CLEARANCE, URINE, 24 HOUR
CREAT CLEAR: 147 mL/min — AB (ref 75–115)
CREATININE 24H UR: 1269 mg/d (ref 700–1800)
CREATININE, URINE: 181.3 mg/dL
CREATININE: 0.6 mg/dL (ref 0.50–1.10)

## 2014-05-19 ENCOUNTER — Telehealth: Payer: Self-pay | Admitting: *Deleted

## 2014-05-19 NOTE — Telephone Encounter (Signed)
Called pt to adv normal 3hour GTT. Pt expressed understanding.

## 2014-05-22 ENCOUNTER — Ambulatory Visit (HOSPITAL_COMMUNITY)
Admission: RE | Admit: 2014-05-22 | Discharge: 2014-05-22 | Disposition: A | Discharge status: Home / Self Care | Payer: 59 | Source: Ambulatory Visit | Attending: Advanced Practice Midwife | Admitting: Advanced Practice Midwife

## 2014-05-22 ENCOUNTER — Encounter (HOSPITAL_COMMUNITY): Payer: Self-pay

## 2014-05-22 DIAGNOSIS — O358XX0 Maternal care for other (suspected) fetal abnormality and damage, not applicable or unspecified: Secondary | ICD-10-CM | POA: Diagnosis not present

## 2014-05-22 DIAGNOSIS — Z3A19 19 weeks gestation of pregnancy: Secondary | ICD-10-CM | POA: Insufficient documentation

## 2014-05-22 DIAGNOSIS — Z3A18 18 weeks gestation of pregnancy: Secondary | ICD-10-CM

## 2014-05-22 DIAGNOSIS — Z36 Encounter for antenatal screening of mother: Secondary | ICD-10-CM | POA: Diagnosis not present

## 2014-05-22 DIAGNOSIS — Z3689 Encounter for other specified antenatal screening: Secondary | ICD-10-CM | POA: Insufficient documentation

## 2014-05-22 DIAGNOSIS — O09292 Supervision of pregnancy with other poor reproductive or obstetric history, second trimester: Secondary | ICD-10-CM | POA: Diagnosis not present

## 2014-05-22 DIAGNOSIS — IMO0002 Reserved for concepts with insufficient information to code with codable children: Secondary | ICD-10-CM

## 2014-05-22 DIAGNOSIS — O09522 Supervision of elderly multigravida, second trimester: Secondary | ICD-10-CM | POA: Diagnosis not present

## 2014-05-22 DIAGNOSIS — O351XX0 Maternal care for (suspected) chromosomal abnormality in fetus, not applicable or unspecified: Secondary | ICD-10-CM

## 2014-05-22 DIAGNOSIS — Z8279 Family history of other congenital malformations, deformations and chromosomal abnormalities: Secondary | ICD-10-CM

## 2014-05-22 DIAGNOSIS — O283 Abnormal ultrasonic finding on antenatal screening of mother: Secondary | ICD-10-CM | POA: Insufficient documentation

## 2014-05-22 DIAGNOSIS — O3421 Maternal care for scar from previous cesarean delivery: Secondary | ICD-10-CM | POA: Diagnosis not present

## 2014-06-06 ENCOUNTER — Ambulatory Visit (INDEPENDENT_AMBULATORY_CARE_PROVIDER_SITE_OTHER): Payer: 59 | Admitting: Family

## 2014-06-06 VITALS — BP 133/71 | HR 105

## 2014-06-06 DIAGNOSIS — O09892 Supervision of other high risk pregnancies, second trimester: Secondary | ICD-10-CM

## 2014-06-06 NOTE — Progress Notes (Signed)
Olivia Moon states had fetal echo completed > nml.  Follow-up ultrasound with MFM already scheduled.  Reviewed early 3 hr results > nml.  Reports "feeling good".

## 2014-06-19 ENCOUNTER — Ambulatory Visit (HOSPITAL_COMMUNITY)
Admission: RE | Admit: 2014-06-19 | Discharge: 2014-06-19 | Disposition: A | Discharge status: Home / Self Care | Payer: 59 | Source: Ambulatory Visit | Attending: Obstetrics & Gynecology | Admitting: Obstetrics & Gynecology

## 2014-06-19 ENCOUNTER — Encounter (HOSPITAL_COMMUNITY): Payer: Self-pay

## 2014-06-19 ENCOUNTER — Other Ambulatory Visit (HOSPITAL_COMMUNITY): Payer: Self-pay | Admitting: Maternal and Fetal Medicine

## 2014-06-19 DIAGNOSIS — O351XX0 Maternal care for (suspected) chromosomal abnormality in fetus, not applicable or unspecified: Secondary | ICD-10-CM

## 2014-06-19 DIAGNOSIS — IMO0002 Reserved for concepts with insufficient information to code with codable children: Secondary | ICD-10-CM

## 2014-06-19 DIAGNOSIS — O358XX0 Maternal care for other (suspected) fetal abnormality and damage, not applicable or unspecified: Secondary | ICD-10-CM | POA: Insufficient documentation

## 2014-06-19 DIAGNOSIS — O09292 Supervision of pregnancy with other poor reproductive or obstetric history, second trimester: Secondary | ICD-10-CM | POA: Insufficient documentation

## 2014-06-19 DIAGNOSIS — O09522 Supervision of elderly multigravida, second trimester: Secondary | ICD-10-CM | POA: Insufficient documentation

## 2014-06-19 DIAGNOSIS — Z0489 Encounter for examination and observation for other specified reasons: Secondary | ICD-10-CM | POA: Insufficient documentation

## 2014-06-19 DIAGNOSIS — Z8279 Family history of other congenital malformations, deformations and chromosomal abnormalities: Secondary | ICD-10-CM

## 2014-06-19 DIAGNOSIS — Z3A22 22 weeks gestation of pregnancy: Secondary | ICD-10-CM | POA: Insufficient documentation

## 2014-07-07 ENCOUNTER — Encounter: Payer: Self-pay | Admitting: Advanced Practice Midwife

## 2014-07-07 ENCOUNTER — Ambulatory Visit (INDEPENDENT_AMBULATORY_CARE_PROVIDER_SITE_OTHER): Payer: 59 | Admitting: Advanced Practice Midwife

## 2014-07-07 VITALS — BP 118/70 | HR 92 | Wt 236.0 lb

## 2014-07-07 DIAGNOSIS — Z8279 Family history of other congenital malformations, deformations and chromosomal abnormalities: Secondary | ICD-10-CM

## 2014-07-07 DIAGNOSIS — O09892 Supervision of other high risk pregnancies, second trimester: Secondary | ICD-10-CM

## 2014-07-07 NOTE — Patient Instructions (Signed)
Second Trimester of Pregnancy The second trimester is from week 13 through week 28, months 4 through 6. The second trimester is often a time when you feel your best. Your body has also adjusted to being pregnant, and you begin to feel better physically. Usually, morning sickness has lessened or quit completely, you may have more energy, and you may have an increase in appetite. The second trimester is also a time when the fetus is growing rapidly. At the end of the sixth month, the fetus is about 9 inches long and weighs about 1 pounds. You will likely begin to feel the baby move (quickening) between 18 and 20 weeks of the pregnancy. BODY CHANGES Your body goes through many changes during pregnancy. The changes vary from woman to woman.   Your weight will continue to increase. You will notice your lower abdomen bulging out.  You may begin to get stretch marks on your hips, abdomen, and breasts.  You may develop headaches that can be relieved by medicines approved by your health care provider.  You may urinate more often because the fetus is pressing on your bladder.  You may develop or continue to have heartburn as a result of your pregnancy.  You may develop constipation because certain hormones are causing the muscles that push waste through your intestines to slow down.  You may develop hemorrhoids or swollen, bulging veins (varicose veins).  You may have back pain because of the weight gain and pregnancy hormones relaxing your joints between the bones in your pelvis and as a result of a shift in weight and the muscles that support your balance.  Your breasts will continue to grow and be tender.  Your gums may bleed and may be sensitive to brushing and flossing.  Dark spots or blotches (chloasma, mask of pregnancy) may develop on your face. This will likely fade after the baby is born.  A dark line from your belly button to the pubic area (linea nigra) may appear. This will likely fade  after the baby is born.  You may have changes in your hair. These can include thickening of your hair, rapid growth, and changes in texture. Some women also have hair loss during or after pregnancy, or hair that feels dry or thin. Your hair will most likely return to normal after your baby is born. WHAT TO EXPECT AT YOUR PRENATAL VISITS During a routine prenatal visit:  You will be weighed to make sure you and the fetus are growing normally.  Your blood pressure will be taken.  Your abdomen will be measured to track your baby's growth.  The fetal heartbeat will be listened to.  Any test results from the previous visit will be discussed. Your health care provider may ask you:  How you are feeling.  If you are feeling the baby move.  If you have had any abnormal symptoms, such as leaking fluid, bleeding, severe headaches, or abdominal cramping.  If you have any questions. Other tests that may be performed during your second trimester include:  Blood tests that check for:  Low iron levels (anemia).  Gestational diabetes (between 24 and 28 weeks).  Rh antibodies.  Urine tests to check for infections, diabetes, or protein in the urine.  An ultrasound to confirm the proper growth and development of the baby.  An amniocentesis to check for possible genetic problems.  Fetal screens for spina bifida and Down syndrome. HOME CARE INSTRUCTIONS   Avoid all smoking, herbs, alcohol, and unprescribed   drugs. These chemicals affect the formation and growth of the baby.  Follow your health care provider's instructions regarding medicine use. There are medicines that are either safe or unsafe to take during pregnancy.  Exercise only as directed by your health care provider. Experiencing uterine cramps is a good sign to stop exercising.  Continue to eat regular, healthy meals.  Wear a good support bra for breast tenderness.  Do not use hot tubs, steam rooms, or saunas.  Wear your  seat belt at all times when driving.  Avoid raw meat, uncooked cheese, cat litter boxes, and soil used by cats. These carry germs that can cause birth defects in the baby.  Take your prenatal vitamins.  Try taking a stool softener (if your health care provider approves) if you develop constipation. Eat more high-fiber foods, such as fresh vegetables or fruit and whole grains. Drink plenty of fluids to keep your urine clear or pale yellow.  Take warm sitz baths to soothe any pain or discomfort caused by hemorrhoids. Use hemorrhoid cream if your health care provider approves.  If you develop varicose veins, wear support hose. Elevate your feet for 15 minutes, 3-4 times a day. Limit salt in your diet.  Avoid heavy lifting, wear low heel shoes, and practice good posture.  Rest with your legs elevated if you have leg cramps or low back pain.  Visit your dentist if you have not gone yet during your pregnancy. Use a soft toothbrush to brush your teeth and be gentle when you floss.  A sexual relationship may be continued unless your health care provider directs you otherwise.  Continue to go to all your prenatal visits as directed by your health care provider. SEEK MEDICAL CARE IF:   You have dizziness.  You have mild pelvic cramps, pelvic pressure, or nagging pain in the abdominal area.  You have persistent nausea, vomiting, or diarrhea.  You have a bad smelling vaginal discharge.  You have pain with urination. SEEK IMMEDIATE MEDICAL CARE IF:   You have a fever.  You are leaking fluid from your vagina.  You have spotting or bleeding from your vagina.  You have severe abdominal cramping or pain.  You have rapid weight gain or loss.  You have shortness of breath with chest pain.  You notice sudden or extreme swelling of your face, hands, ankles, feet, or legs.  You have not felt your baby move in over an hour.  You have severe headaches that do not go away with  medicine.  You have vision changes. Document Released: 03/15/2001 Document Revised: 03/26/2013 Document Reviewed: 05/22/2012 Exit[employer] Patient Information 2015 [employer]. This information is not intended to replace advice given to you by your health care provider. Make sure you discuss any questions you have with your health care provider.  

## 2014-07-07 NOTE — Progress Notes (Signed)
Doing well. Has f/u growth [place] scheduled, reviewed reasons for doing so.  + allergies, reviewed meds and saline sprays.

## 2014-07-21 ENCOUNTER — Encounter: Payer: 59 | Admitting: Advanced Practice Midwife

## 2014-07-23 ENCOUNTER — Ambulatory Visit (HOSPITAL_COMMUNITY)
Admission: RE | Admit: 2014-07-23 | Discharge: 2014-07-23 | Disposition: A | Discharge status: Home / Self Care | Payer: 59 | Source: Ambulatory Visit | Attending: Advanced Practice Midwife | Admitting: Advanced Practice Midwife

## 2014-07-23 ENCOUNTER — Encounter (HOSPITAL_COMMUNITY): Payer: Self-pay

## 2014-07-23 DIAGNOSIS — Z3A27 27 weeks gestation of pregnancy: Secondary | ICD-10-CM | POA: Insufficient documentation

## 2014-07-23 DIAGNOSIS — O09292 Supervision of pregnancy with other poor reproductive or obstetric history, second trimester: Secondary | ICD-10-CM | POA: Diagnosis not present

## 2014-07-23 DIAGNOSIS — O3421 Maternal care for scar from previous cesarean delivery: Secondary | ICD-10-CM | POA: Diagnosis not present

## 2014-07-23 DIAGNOSIS — O09522 Supervision of elderly multigravida, second trimester: Secondary | ICD-10-CM | POA: Insufficient documentation

## 2014-07-23 DIAGNOSIS — Z8279 Family history of other congenital malformations, deformations and chromosomal abnormalities: Secondary | ICD-10-CM

## 2014-07-23 DIAGNOSIS — O358XX0 Maternal care for other (suspected) fetal abnormality and damage, not applicable or unspecified: Secondary | ICD-10-CM | POA: Insufficient documentation

## 2014-07-23 DIAGNOSIS — IMO0002 Reserved for concepts with insufficient information to code with codable children: Secondary | ICD-10-CM

## 2014-07-23 DIAGNOSIS — O351XX0 Maternal care for (suspected) chromosomal abnormality in fetus, not applicable or unspecified: Secondary | ICD-10-CM

## 2014-07-25 ENCOUNTER — Ambulatory Visit (INDEPENDENT_AMBULATORY_CARE_PROVIDER_SITE_OTHER): Payer: 59 | Admitting: Obstetrics and Gynecology

## 2014-07-25 VITALS — BP 116/69 | HR 103 | Wt 240.0 lb

## 2014-07-25 DIAGNOSIS — Z9889 Other specified postprocedural states: Secondary | ICD-10-CM

## 2014-07-25 DIAGNOSIS — Z36 Encounter for antenatal screening of mother: Secondary | ICD-10-CM | POA: Diagnosis not present

## 2014-07-25 DIAGNOSIS — O09892 Supervision of other high risk pregnancies, second trimester: Secondary | ICD-10-CM

## 2014-07-25 DIAGNOSIS — O0992 Supervision of high risk pregnancy, unspecified, second trimester: Secondary | ICD-10-CM

## 2014-07-25 DIAGNOSIS — Z98891 History of uterine scar from previous surgery: Secondary | ICD-10-CM

## 2014-07-25 DIAGNOSIS — Z23 Encounter for immunization: Secondary | ICD-10-CM | POA: Diagnosis not present

## 2014-07-25 LAB — CBC
HEMATOCRIT: 30.5 % — AB (ref 36.0–46.0)
Hemoglobin: 10.4 g/dL — ABNORMAL LOW (ref 12.0–15.0)
MCH: 29.2 pg (ref 26.0–34.0)
MCHC: 34.1 g/dL (ref 30.0–36.0)
MCV: 85.7 fL (ref 78.0–100.0)
MPV: 10.1 fL (ref 8.6–12.4)
Platelets: 196 10*3/uL (ref 150–400)
RBC: 3.56 MIL/uL — AB (ref 3.87–5.11)
RDW: 14.9 % (ref 11.5–15.5)
WBC: 8.3 10*3/uL (ref 4.0–10.5)

## 2014-07-25 NOTE — Patient Instructions (Signed)
Third Trimester of Pregnancy The third trimester is from week 29 through week 42, months 7 through 9. The third trimester is a time when the fetus is growing rapidly. At the end of the ninth month, the fetus is about 20 inches in length and weighs 6-10 pounds.  BODY CHANGES Your body goes through many changes during pregnancy. The changes vary from woman to woman.   Your weight will continue to increase. You can expect to gain 25-35 pounds (11-16 kg) by the end of the pregnancy.  You may begin to get stretch marks on your hips, abdomen, and breasts.  You may urinate more often because the fetus is moving lower into your pelvis and pressing on your bladder.  You may develop or continue to have heartburn as a result of your pregnancy.  You may develop constipation because certain hormones are causing the muscles that push waste through your intestines to slow down.  You may develop hemorrhoids or swollen, bulging veins (varicose veins).  You may have pelvic pain because of the weight gain and pregnancy hormones relaxing your joints between the bones in your pelvis. Backaches may result from overexertion of the muscles supporting your posture.  You may have changes in your hair. These can include thickening of your hair, rapid growth, and changes in texture. Some women also have hair loss during or after pregnancy, or hair that feels dry or thin. Your hair will most likely return to normal after your baby is born.  Your breasts will continue to grow and be tender. A yellow discharge may leak from your breasts called colostrum.  Your belly button may stick out.  You may feel short of breath because of your expanding uterus.  You may notice the fetus "dropping," or moving lower in your abdomen.  You may have a bloody mucus discharge. This usually occurs a few days to a week before labor begins.  Your cervix becomes thin and soft (effaced) near your due date. WHAT TO EXPECT AT YOUR PRENATAL  EXAMS  You will have prenatal exams every 2 weeks until week 36. Then, you will have weekly prenatal exams. During a routine prenatal visit:  You will be weighed to make sure you and the fetus are growing normally.  Your blood pressure is taken.  Your abdomen will be measured to track your baby's growth.  The fetal heartbeat will be listened to.  Any test results from the previous visit will be discussed.  You may have a cervical check near your due date to see if you have effaced. At around 36 weeks, your caregiver will check your cervix. At the same time, your caregiver will also perform a test on the secretions of the vaginal tissue. This test is to determine if a type of bacteria, Group B streptococcus, is present. Your caregiver will explain this further. Your caregiver may ask you:  What your birth plan is.  How you are feeling.  If you are feeling the baby move.  If you have had any abnormal symptoms, such as leaking fluid, bleeding, severe headaches, or abdominal cramping.  If you have any questions. Other tests or screenings that may be performed during your third trimester include:  Blood tests that check for low iron levels (anemia).  Fetal testing to check the health, activity level, and growth of the fetus. Testing is done if you have certain medical conditions or if there are problems during the pregnancy. FALSE LABOR You may feel small, irregular contractions that   eventually go away. These are called Braxton Hicks contractions, or false labor. Contractions may last for hours, days, or even weeks before true labor sets in. If contractions come at regular intervals, intensify, or become painful, it is best to be seen by your caregiver.  SIGNS OF LABOR   Menstrual-like cramps.  Contractions that are 5 minutes apart or less.  Contractions that start on the top of the uterus and spread down to the lower abdomen and back.  A sense of increased pelvic pressure or back  pain.  A watery or bloody mucus discharge that comes from the vagina. If you have any of these signs before the 37th week of pregnancy, call your caregiver right away. You need to go to the hospital to get checked immediately. HOME CARE INSTRUCTIONS   Avoid all smoking, herbs, alcohol, and unprescribed drugs. These chemicals affect the formation and growth of the baby.  Follow your caregiver's instructions regarding medicine use. There are medicines that are either safe or unsafe to take during pregnancy.  Exercise only as directed by your caregiver. Experiencing uterine cramps is a good sign to stop exercising.  Continue to eat regular, healthy meals.  Wear a good support bra for breast tenderness.  Do not use hot tubs, steam rooms, or saunas.  Wear your seat belt at all times when driving.  Avoid raw meat, uncooked cheese, cat litter boxes, and soil used by cats. These carry germs that can cause birth defects in the baby.  Take your prenatal vitamins.  Try taking a stool softener (if your caregiver approves) if you develop constipation. Eat more high-fiber foods, such as fresh vegetables or fruit and whole grains. Drink plenty of fluids to keep your urine clear or pale yellow.  Take warm sitz baths to soothe any pain or discomfort caused by hemorrhoids. Use hemorrhoid cream if your caregiver approves.  If you develop varicose veins, wear support hose. Elevate your feet for 15 minutes, 3-4 times a day. Limit salt in your diet.  Avoid heavy lifting, wear low heal shoes, and practice good posture.  Rest a lot with your legs elevated if you have leg cramps or low back pain.  Visit your dentist if you have not gone during your pregnancy. Use a soft toothbrush to brush your teeth and be gentle when you floss.  A sexual relationship may be continued unless your caregiver directs you otherwise.  Do not travel far distances unless it is absolutely necessary and only with the approval  of your caregiver.  Take prenatal classes to understand, practice, and ask questions about the labor and delivery.  Make a trial run to the hospital.  Pack your hospital bag.  Prepare the baby's nursery.  Continue to go to all your prenatal visits as directed by your caregiver. SEEK MEDICAL CARE IF:  You are unsure if you are in labor or if your water has broken.  You have dizziness.  You have mild pelvic cramps, pelvic pressure, or nagging pain in your abdominal area.  You have persistent nausea, vomiting, or diarrhea.  You have a bad smelling vaginal discharge.  You have pain with urination. SEEK IMMEDIATE MEDICAL CARE IF:   You have a fever.  You are leaking fluid from your vagina.  You have spotting or bleeding from your vagina.  You have severe abdominal cramping or pain.  You have rapid weight loss or gain.  You have shortness of breath with chest pain.  You notice sudden or extreme swelling   of your face, hands, ankles, feet, or legs.  You have not felt your baby move in over an hour.  You have severe headaches that do not go away with medicine.  You have vision changes. Document Released: 03/15/2001 Document Revised: 03/26/2013 Document Reviewed: 05/22/2012 [employer] Patient Information 2015 [employer]. This information is not intended to replace advice given to you by your health care provider. Make sure you discuss any questions you have with your health care provider.  

## 2014-07-25 NOTE — Progress Notes (Signed)
Doing well, reports dependent edema. [place] reviewed> good growth, normal fluid. Had thickened nuchal fold on early scan so got NIPS (nl) and fetal echo (nl). Desires TOLAC> discussed R/B including short interval from C/S. Will sign consent. Had 1 borderline value on early 3hr OGTT> 28 wk labs with GCT today. Advised no simple COHs and healthy diet.  Discussed risk at least 30% recurrence of preE. Not seeing mid tri BP drop from 130/81 baseline at 12 wks.. Knows to report H/A, visual sx. FM awareness reviewed.

## 2014-07-26 LAB — HIV ANTIBODY (ROUTINE TESTING W REFLEX): HIV 1&2 Ab, 4th Generation: NONREACTIVE

## 2014-07-26 LAB — RPR

## 2014-07-26 LAB — GLUCOSE TOLERANCE, 1 HOUR (50G) W/O FASTING: GLUCOSE 1 HOUR GTT: 132 mg/dL (ref 70–140)

## 2014-07-29 ENCOUNTER — Telehealth: Payer: Self-pay | Admitting: *Deleted

## 2014-07-29 NOTE — Telephone Encounter (Signed)
Olivia Moon, CNM reviewed 1 hr GTT and per recommendations pt did pass her 1 hr GTT.  LM on voicemail

## 2014-07-31 ENCOUNTER — Ambulatory Visit (HOSPITAL_COMMUNITY): Payer: 59

## 2014-08-06 ENCOUNTER — Encounter: Payer: Self-pay | Admitting: *Deleted

## 2014-08-08 ENCOUNTER — Ambulatory Visit (INDEPENDENT_AMBULATORY_CARE_PROVIDER_SITE_OTHER): Payer: 59 | Admitting: Advanced Practice Midwife

## 2014-08-08 VITALS — BP 120/73 | HR 102 | Wt 242.0 lb

## 2014-08-08 DIAGNOSIS — R519 Headache, unspecified: Secondary | ICD-10-CM

## 2014-08-08 DIAGNOSIS — O0992 Supervision of high risk pregnancy, unspecified, second trimester: Secondary | ICD-10-CM

## 2014-08-08 DIAGNOSIS — O26893 Other specified pregnancy related conditions, third trimester: Secondary | ICD-10-CM

## 2014-08-08 DIAGNOSIS — R51 Headache: Secondary | ICD-10-CM

## 2014-08-08 MED ORDER — BUTALBITAL-APAP-CAFFEINE 50-500-40 MG PO TABS: 1.0000 | ORAL_TABLET | ORAL | Status: DC | PRN

## 2014-08-08 NOTE — Progress Notes (Signed)
Doing well.  Good fetal movement, denies vaginal bleeding, LOF, regular contractions.  Does have headaches daily, starting recently.  Tylenol sometimes helps, sometimes not.  GTT and BP normal.  Discussed increasing PO fluids. Fioricet Rx sent to pharmacy for pt to try.  Preeclampsia precautions discussed/when to call office or come to hospital.

## 2014-08-12 ENCOUNTER — Ambulatory Visit (INDEPENDENT_AMBULATORY_CARE_PROVIDER_SITE_OTHER): Payer: 59 | Admitting: Obstetrics & Gynecology

## 2014-08-12 VITALS — BP 121/79 | HR 109 | Wt 244.0 lb

## 2014-08-12 DIAGNOSIS — O09892 Supervision of other high risk pregnancies, second trimester: Secondary | ICD-10-CM

## 2014-08-12 NOTE — Progress Notes (Signed)
Work in visit. She has been having hard BH contractions with increasing pressure. She denies VB and ROM. She reports good FM.

## 2014-08-22 ENCOUNTER — Inpatient Hospital Stay (HOSPITAL_COMMUNITY)
Admission: AD | Admit: 2014-08-22 | Discharge: 2014-08-22 | Disposition: A | Discharge status: Home / Self Care | Payer: 59 | Source: Ambulatory Visit | Attending: Family Medicine | Admitting: Family Medicine

## 2014-08-22 ENCOUNTER — Ambulatory Visit (INDEPENDENT_AMBULATORY_CARE_PROVIDER_SITE_OTHER): Payer: 59 | Admitting: Certified Nurse Midwife

## 2014-08-22 ENCOUNTER — Encounter (HOSPITAL_COMMUNITY): Payer: Self-pay

## 2014-08-22 VITALS — BP 133/85 | HR 104 | Wt 250.0 lb

## 2014-08-22 DIAGNOSIS — R51 Headache: Secondary | ICD-10-CM | POA: Diagnosis present

## 2014-08-22 DIAGNOSIS — O133 Gestational [pregnancy-induced] hypertension without significant proteinuria, third trimester: Secondary | ICD-10-CM | POA: Insufficient documentation

## 2014-08-22 DIAGNOSIS — O1210 Gestational proteinuria, unspecified trimester: Secondary | ICD-10-CM

## 2014-08-22 DIAGNOSIS — Z3A31 31 weeks gestation of pregnancy: Secondary | ICD-10-CM | POA: Insufficient documentation

## 2014-08-22 DIAGNOSIS — Z3483 Encounter for supervision of other normal pregnancy, third trimester: Secondary | ICD-10-CM

## 2014-08-22 HISTORY — DX: Gestational (pregnancy-induced) hypertension without significant proteinuria, unspecified trimester: O13.9

## 2014-08-22 LAB — URINE MICROSCOPIC-ADD ON

## 2014-08-22 LAB — COMPREHENSIVE METABOLIC PANEL
ALBUMIN: 2.8 g/dL — AB (ref 3.5–5.0)
ALT: 14 U/L (ref 14–54)
ANION GAP: 11 (ref 5–15)
AST: 20 U/L (ref 15–41)
Alkaline Phosphatase: 70 U/L (ref 38–126)
BUN: 8 mg/dL (ref 6–20)
CO2: 19 mmol/L — ABNORMAL LOW (ref 22–32)
CREATININE: 0.56 mg/dL (ref 0.44–1.00)
Calcium: 8.3 mg/dL — ABNORMAL LOW (ref 8.9–10.3)
Chloride: 105 mmol/L (ref 101–111)
GFR calc Af Amer: >60 mL/min (ref >60)
GFR calc non Af Amer: >60 mL/min (ref >60)
Glucose, Bld: 134 mg/dL — ABNORMAL HIGH (ref 65–99)
Potassium: 4 mmol/L (ref 3.5–5.1)
Sodium: 135 mmol/L (ref 135–145)
TOTAL PROTEIN: 6.6 g/dL (ref 6.5–8.1)
Total Bilirubin: 0.1 mg/dL — ABNORMAL LOW (ref 0.3–1.2)

## 2014-08-22 LAB — CBC
HCT: 29.9 % — ABNORMAL LOW (ref 36.0–46.0)
HEMOGLOBIN: 10.1 g/dL — AB (ref 12.0–15.0)
MCH: 28.8 pg (ref 26.0–34.0)
MCHC: 33.8 g/dL (ref 30.0–36.0)
MCV: 85.2 fL (ref 78.0–100.0)
Platelets: 186 10*3/uL (ref 150–400)
RBC: 3.51 MIL/uL — ABNORMAL LOW (ref 3.87–5.11)
RDW: 15.4 % (ref 11.5–15.5)
WBC: 9 10*3/uL (ref 4.0–10.5)

## 2014-08-22 LAB — URINALYSIS, ROUTINE W REFLEX MICROSCOPIC
Bilirubin Urine: NEGATIVE
Glucose, UA: NEGATIVE mg/dL
Ketones, ur: NEGATIVE mg/dL
LEUKOCYTES UA: NEGATIVE
Nitrite: NEGATIVE
PH: 6 (ref 5.0–8.0)
Protein, ur: NEGATIVE mg/dL
Specific Gravity, Urine: 1.015 (ref 1.005–1.030)
Urobilinogen, UA: 0.2 mg/dL (ref 0.0–1.0)

## 2014-08-22 LAB — PROTEIN / CREATININE RATIO, URINE
CREATININE, URINE: 46 mg/dL
PROTEIN CREATININE RATIO: 0.22 mg/mg{creat} — AB (ref 0.00–0.15)
Total Protein, Urine: 10 mg/dL

## 2014-08-22 MED ORDER — DEXAMETHASONE SODIUM PHOSPHATE 10 MG/ML IJ SOLN
10.0000 mg | Freq: Once | INTRAMUSCULAR | Status: AC
  Administered 2014-08-22: 10 mg via INTRAVENOUS
  Filled 2014-08-22: qty 1

## 2014-08-22 MED ORDER — PROCHLORPERAZINE EDISYLATE 5 MG/ML IJ SOLN
10.0000 mg | Freq: Once | INTRAMUSCULAR | Status: AC
  Administered 2014-08-22: 10 mg via INTRAVENOUS
  Filled 2014-08-22: qty 2

## 2014-08-22 MED ORDER — LACTATED RINGERS IV BOLUS (SEPSIS)
1000.0000 mL | Freq: Once | INTRAVENOUS | Status: AC
  Administered 2014-08-22: 1000 mL via INTRAVENOUS

## 2014-08-22 MED ORDER — ACETAMINOPHEN 325 MG PO TABS: 650.0000 mg | ORAL_TABLET | ORAL | Status: DC | PRN

## 2014-08-22 MED ORDER — DIPHENHYDRAMINE HCL 50 MG/ML IJ SOLN
25.0000 mg | Freq: Once | INTRAMUSCULAR | Status: AC
  Administered 2014-08-22: 25 mg via INTRAVENOUS
  Filled 2014-08-22: qty 1

## 2014-08-22 MED ORDER — BUTALBITAL-APAP-CAFFEINE 50-325-40 MG PO TABS
1.0000 | ORAL_TABLET | ORAL | Status: AC
  Administered 2014-08-22: 2 via ORAL
  Filled 2014-08-22: qty 2

## 2014-08-22 MED ORDER — BUTALBITAL-APAP-CAFFEINE 50-500-40 MG PO TABS: 1.0000 | ORAL_TABLET | ORAL | Status: DC | PRN

## 2014-08-22 NOTE — Progress Notes (Signed)
Ketones-mod 

## 2014-08-22 NOTE — MAU Note (Signed)
Pt given Decadron IV and stated she "felt itchy all over and my throat is tight." Pulse ox applied and ordered benedryl given. D. Poe at bedside to evaluate pt. O2 saturation 100%. Pt feeling relief after benedryl.

## 2014-08-22 NOTE — H&P (Signed)
Patient is 34 y.o. [id number] [redacted]w[redacted]d here with complaints of headaches and swelling. Patient states that the last few days she has been feeling unwell. She knew that something was changing with her body. Patient endorses symptoms of whole-body swelling, headaches, and intermittent right upper quadrant pain. She also states she has hazy vision. Of note patient has history of severe preeclampsia with pregnancies. She presents with the same symptoms in all her pregnancies. She has had magnesium for 2 of her 3 pregnancies. Patient has been feeling unwell for the last several days and decided to wait until she went to her doctor's appointment today. At [place] office patient was noted to have 1+ pitting edema and a 7.5 pound weight gain within the last 10 days. Urine protein in office was 1+. Blood pressures were appropriate. Patient was then sent over to the MAU for further evaluation.  +FM, + braxton-hicks contractions  denies LOF, VB, vaginal discharge  PE: General: alert, NAD, cooperative, obese HEENT: NCAT, PERRLA, no injection and anicteric. EOMI. Dry mucous membranes, oral mucosa and oropharynx reveal no lesions or exudates.  Lungs: CTAB, normal respiratory effort Heart: RRR, no M/R/G.  Abdomen: Gravid uterus. TTP in RUQ.  Extremities: 2+ pitting edema up to knees, diffuse edema Neurologic: No focal deficits, +5 strength globally, sensation grossly intact, gait normal, A&Ox3. Deep tendon reflexes diminished Skin: Intact without suspicious lesions or rashes. Warm and dry.  FHR reactive.   MDM:  -Preeclampsia labs ordered including CBC, CMP, UA, pro/cr ratio -Patient given 2 tablets of her home headache medication; will consider headache cocktail if unresponsive to medications   Will re-evaluate patient after headache medication and lab results return.  Manya Silvas, CNM assumed care of pt at 1200.   Pain 6/10 after Fioricet. Requesting more pain meds. Labs normal. BP mildly elevated. Will  give migraine cocktail.  Pt had episode of intense itching and brief episode of SOB after Decadron dose. Benadryl given. Sx resolved. O2 SAt 100% on RA. Deirdre Conard Novak, CNM called to BS. To assess pt. Lungs CTAB, moving air well. No swelling of lips or tongue. Will CTO closely.  Pt reports no further itching or SOB. HA and epigastric pain resolved completely. Labs, BP, Sx, ? Allergic reaction to Benadryl discussed w/ Dr. Nehemiah Settle. Pt stable for D/C home w/ F/U in 3 days at office.   ASSESSMENT: 1. Gestational hypertension w/o significant proteinuria in 3rd trimester    PLAN: D/C home in stable condition per consult w/ Dr. Nehemiah Settle. Pre-E precautions. Fioricet PRN for HA. To MAU for HA unresponsive to Fioricet.  Follow-up Information    Follow up with [facility] On 08/25/2014.   Specialty:  Obstetrics and Gynecology   Why:  For 24 hour urine, blood pressure check and NST.    Contact information:   [place], [place] [phone]      Follow up with [facility].   Why:  As needed if symptoms worsen   Contact information:   [address] [id number] [place] [phone]       Medication List    TAKE these medications        acetaminophen 500 MG tablet  Commonly known as:  TYLENOL  Take 1,000 mg by mouth every 6 (six) hours as needed for headache.     butalbital-acetaminophen-caffeine 50-500-40 MG per tablet  Commonly known as:  ESGIC PLUS  Take 1-2 tablets  by mouth every 4 (four) hours as needed for pain or headache.     prenatal multivitamin Tabs tablet  Take 1 tablet by mouth daily at 12 noon.        [place], [place] 08/22/2014 4:33 PM

## 2014-08-22 NOTE — Patient Instructions (Signed)

## 2014-08-22 NOTE — Progress Notes (Signed)
Pt has headache since yesterday and has taken narcotic for at 430am this morning and still no relief. She states she feels like she is getting sick. She denies visual disturbances; epigastric pain. Reflexes normal. Will send to MAU for serial blood pressures and preeclampsia labs.

## 2014-08-22 NOTE — Discharge Instructions (Signed)
Hypertension During Pregnancy °Hypertension, or high blood pressure, is when there is extra pressure inside your blood vessels that carry blood from the heart to the rest of your body (arteries). It can happen at any time in life, including pregnancy. Hypertension during pregnancy can cause problems for you and your baby. Your baby might not weigh as much as he or she should at birth or might be born early (premature). Very bad cases of hypertension during pregnancy can be life-threatening.  °Different types of hypertension can occur during pregnancy. These include: °· Chronic hypertension. This happens when a woman has hypertension before pregnancy and it continues during pregnancy. °· Gestational hypertension. This is when hypertension develops during pregnancy. °· Preeclampsia or toxemia of pregnancy. This is a very serious type of hypertension that develops only during pregnancy. It affects the whole body and can be very dangerous for both mother and baby.   °Gestational hypertension and preeclampsia usually go away after your baby is born. Your blood pressure will likely stabilize within 6 weeks. Women who have hypertension during pregnancy have a greater chance of developing hypertension later in life or with future pregnancies. °RISK FACTORS °There are certain factors that make it more likely for you to develop hypertension during pregnancy. These include: °1. Having hypertension before pregnancy. °2. Having hypertension during a previous pregnancy. °3. Being overweight. °4. Being older than 40 years. °5. Being pregnant with more than one baby. °6. Having diabetes or kidney problems. °SIGNS AND SYMPTOMS °Chronic and gestational hypertension rarely cause symptoms. Preeclampsia has symptoms, which may include: °· Increased protein in your urine. Your health care provider will check for this at every prenatal visit. °· Swelling of your hands and face. °· Rapid weight gain. °· Headaches. °· Visual  changes. °· Being bothered by light. °· Abdominal pain, especially in the upper right area. °· Chest pain. °· Shortness of breath. °· Increased reflexes. °· Seizures. These occur with a more severe form of preeclampsia, called eclampsia. °DIAGNOSIS  °You may be diagnosed with hypertension during a regular prenatal exam. At each prenatal visit, you may have: °· Your blood pressure checked. °· A urine test to check for protein in your urine. °The type of hypertension you are diagnosed with depends on when you developed it. It also depends on your specific blood pressure reading. °· Developing hypertension before 20 weeks of pregnancy is consistent with chronic hypertension. °· Developing hypertension after 20 weeks of pregnancy is consistent with gestational hypertension. °· Hypertension with increased urinary protein is diagnosed as preeclampsia. °· Blood pressure measurements that stay above 160 systolic or 110 diastolic are a sign of severe preeclampsia. °TREATMENT °Treatment for hypertension during pregnancy varies. Treatment depends on the type of hypertension and how serious it is. °· If you take medicine for chronic hypertension, you may need to switch medicines. °¨ Medicines called ACE inhibitors should not be taken during pregnancy. °¨ Low-dose aspirin may be suggested for women who have risk factors for preeclampsia. °· If you have gestational hypertension, you may need to take a blood pressure medicine that is safe during pregnancy. Your health care provider will recommend the correct medicine. °· If you have severe preeclampsia, you may need to be in the hospital. Health care providers will watch you and your baby very closely. You also may need to take medicine called magnesium sulfate to prevent seizures and lower blood pressure. °· Sometimes, an early delivery is needed. This may be the case if the condition worsens. It would be   done to protect you and your baby. The only cure for preeclampsia is  delivery.  Your health care provider may recommend that you take one low-dose aspirin (81 mg) each day to help prevent high blood pressure during your pregnancy if you are at risk for preeclampsia. You may be at risk for preeclampsia if:  You had preeclampsia or eclampsia during a previous pregnancy.  Your baby did not grow as expected during a previous pregnancy.  You experienced preterm birth with a previous pregnancy.  You experienced a separation of the placenta from the uterus (placental abruption) during a previous pregnancy.  You experienced the loss of your baby during a previous pregnancy.  You are pregnant with more than one baby.  You have other medical conditions, such as diabetes or an autoimmune disease. HOME CARE INSTRUCTIONS  Schedule and keep all of your regular prenatal care appointments. This is important.  Take medicines only as directed by your health care provider. Tell your health care provider about all medicines you take.  Eat as little salt as possible.  Get regular exercise.  Do not drink alcohol.  Do not use tobacco products.  Do not drink products with caffeine.  Lie on your left side when resting. SEEK IMMEDIATE MEDICAL CARE IF:  You have severe abdominal pain.  You have sudden swelling in your hands, ankles, or face.  You gain 4 pounds (1.8 kg) or more in 1 week.  You vomit repeatedly.  You have vaginal bleeding.  You do not feel your baby moving as much.  You have a headache.  You have blurred or double vision.  You have muscle twitching or spasms.  You have shortness of breath.  You have blue fingernails or lips.  You have blood in your urine. MAKE SURE YOU:  Understand these instructions.  Will watch your condition.  Will get help right away if you are not doing well or get worse. Document Released: 12/07/2010 Document Revised: 08/05/2013 Document Reviewed: 10/18/2012 [facility] Patient Information 2015 [place],  [place]. This information is not intended to replace advice given to you by your health care provider. Make sure you discuss any questions you have with your health care provider.  24-Hour Urine Collection HOME CARE  When you get up in the morning on the day you do this test, pee (urinate) in the toilet and flush. Make a note of the time. This will be your start time on the day of collection and the end time on the next morning.  From then on, save all your pee (urine) in the plastic jug that was given to you.  You should stop collecting your pee 24 hours after you started.  If the plastic jug that is given to you already has liquid in it, that is okay. Do not throw out the liquid or rinse out the jug. Some tests need the liquid to be added to your pee.  Keep your plastic jug cool (in an ice chest or the refrigerator) during the test.  When the 24 hours is over, bring your plastic jug to the clinic lab. Keep the jug cool (in an ice chest) while you are bringing it to the lab. Document Released: 06/17/2008 Document Revised: 06/13/2011 Document Reviewed: 06/17/2008 [facility] Patient Information 2015 [place], [place]. This information is not intended to replace advice given to you by your health care provider. Make sure you discuss any questions you have with your health care provider.

## 2014-08-22 NOTE — MAU Note (Signed)
Pt presents from the office for evaluation for preeclampsia. Reports HA, blurred vision, and epigastric pain. Reports good fetal movement.

## 2014-08-22 NOTE — MAU Note (Signed)
Urine in lab 

## 2014-08-25 ENCOUNTER — Ambulatory Visit (INDEPENDENT_AMBULATORY_CARE_PROVIDER_SITE_OTHER): Payer: 59 | Admitting: Advanced Practice Midwife

## 2014-08-25 ENCOUNTER — Encounter: Payer: Self-pay | Admitting: Advanced Practice Midwife

## 2014-08-25 VITALS — BP 126/69 | HR 100 | Wt 250.0 lb

## 2014-08-25 DIAGNOSIS — Z3483 Encounter for supervision of other normal pregnancy, third trimester: Secondary | ICD-10-CM

## 2014-08-25 DIAGNOSIS — O1210 Gestational proteinuria, unspecified trimester: Secondary | ICD-10-CM | POA: Diagnosis not present

## 2014-08-25 NOTE — Progress Notes (Signed)
Subjective:   KAMILIA CAROLLO is a 34 y.o. [phone] [redacted]w[redacted]d being seen today for her obstetrical visit.  Patient reports Headache improved, edema improved.  Denies contractions, vaginal bleeding or leaking of fluid.  Reports good fetal movement.  The following portions of the patient's history were reviewed and updated as appropriate: allergies, current medications, past family history, past medical history, past social history, past surgical history and problem list.   Objective:  BP 126/69 mmHg  Pulse 100  Wt 250 lb (113.399 kg)  LMP 11/25/2012 (LMP Unknown)  FHT: Fetal Heart Rate (bpm): NST  Uterine Size:    Fetal Movement: Movement: Present  Presentation:      Abdomen:  soft, gravid, appropriate for gestational age,non-tender  Vaginal:  Discharge, white   Normal discharge  Cervix exam deferred     No results found for this or any previous visit (from the past 24 hour(s)). NST done today  Assessment and Plan:   Pregnancy:  [id number] at [redacted]w[redacted]d  1. Proteinuria affecting pregnancy Trace protein today - Creatinine Clearance, Urine, 24 hour - Protein, Urine, 24 hour    Preterm labor symptoms: vaginal bleeding, contractions and leaking of fluid reviewed in detail.  Fetal movement precautions reviewed.  Follow up in one week .. will repeat labs Monday.  Seabron Spates, CNM

## 2014-08-27 ENCOUNTER — Encounter: Payer: Self-pay | Admitting: Advanced Practice Midwife

## 2014-08-27 LAB — CREATININE CLEARANCE, URINE, 24 HOUR
CREAT CLEAR: 204 mL/min — AB (ref 75–115)
CREATININE, URINE: 83.1 mg/dL
CREATININE: 0.56 mg/dL (ref 0.50–1.10)
Creatinine, 24H Ur: 1641 mg/d (ref 700–1800)

## 2014-08-27 LAB — PROTEIN, URINE, 24 HOUR
PROTEIN 24H UR: 375 mg/d — AB (ref <150)
Protein, Urine: 19 mg/dL (ref 5–24)

## 2014-08-27 NOTE — Patient Instructions (Signed)
Third Trimester of Pregnancy The third trimester is from week 29 through week 42, months 7 through 9. The third trimester is a time when the fetus is growing rapidly. At the end of the ninth month, the fetus is about 20 inches in length and weighs 6-10 pounds.  BODY CHANGES Your body goes through many changes during pregnancy. The changes vary from woman to woman.   Your weight will continue to increase. You can expect to gain 25-35 pounds (11-16 kg) by the end of the pregnancy.  You may begin to get stretch marks on your hips, abdomen, and breasts.  You may urinate more often because the fetus is moving lower into your pelvis and pressing on your bladder.  You may develop or continue to have heartburn as a result of your pregnancy.  You may develop constipation because certain hormones are causing the muscles that push waste through your intestines to slow down.  You may develop hemorrhoids or swollen, bulging veins (varicose veins).  You may have pelvic pain because of the weight gain and pregnancy hormones relaxing your joints between the bones in your pelvis. Backaches may result from overexertion of the muscles supporting your posture.  You may have changes in your hair. These can include thickening of your hair, rapid growth, and changes in texture. Some women also have hair loss during or after pregnancy, or hair that feels dry or thin. Your hair will most likely return to normal after your baby is born.  Your breasts will continue to grow and be tender. A yellow discharge may leak from your breasts called colostrum.  Your belly button may stick out.  You may feel short of breath because of your expanding uterus.  You may notice the fetus "dropping," or moving lower in your abdomen.  You may have a bloody mucus discharge. This usually occurs a few days to a week before labor begins.  Your cervix becomes thin and soft (effaced) near your due date. WHAT TO EXPECT AT YOUR PRENATAL  EXAMS  You will have prenatal exams every 2 weeks until week 36. Then, you will have weekly prenatal exams. During a routine prenatal visit:  You will be weighed to make sure you and the fetus are growing normally.  Your blood pressure is taken.  Your abdomen will be measured to track your baby's growth.  The fetal heartbeat will be listened to.  Any test results from the previous visit will be discussed.  You may have a cervical check near your due date to see if you have effaced. At around 36 weeks, your caregiver will check your cervix. At the same time, your caregiver will also perform a test on the secretions of the vaginal tissue. This test is to determine if a type of bacteria, Group B streptococcus, is present. Your caregiver will explain this further. Your caregiver may ask you:  What your birth plan is.  How you are feeling.  If you are feeling the baby move.  If you have had any abnormal symptoms, such as leaking fluid, bleeding, severe headaches, or abdominal cramping.  If you have any questions. Other tests or screenings that may be performed during your third trimester include:  Blood tests that check for low iron levels (anemia).  Fetal testing to check the health, activity level, and growth of the fetus. Testing is done if you have certain medical conditions or if there are problems during the pregnancy. FALSE LABOR You may feel small, irregular contractions that   eventually go away. These are called Braxton Hicks contractions, or false labor. Contractions may last for hours, days, or even weeks before true labor sets in. If contractions come at regular intervals, intensify, or become painful, it is best to be seen by your caregiver.  SIGNS OF LABOR   Menstrual-like cramps.  Contractions that are 5 minutes apart or less.  Contractions that start on the top of the uterus and spread down to the lower abdomen and back.  A sense of increased pelvic pressure or back  pain.  A watery or bloody mucus discharge that comes from the vagina. If you have any of these signs before the 37th week of pregnancy, call your caregiver right away. You need to go to the hospital to get checked immediately. HOME CARE INSTRUCTIONS   Avoid all smoking, herbs, alcohol, and unprescribed drugs. These chemicals affect the formation and growth of the baby.  Follow your caregiver's instructions regarding medicine use. There are medicines that are either safe or unsafe to take during pregnancy.  Exercise only as directed by your caregiver. Experiencing uterine cramps is a good sign to stop exercising.  Continue to eat regular, healthy meals.  Wear a good support bra for breast tenderness.  Do not use hot tubs, steam rooms, or saunas.  Wear your seat belt at all times when driving.  Avoid raw meat, uncooked cheese, cat litter boxes, and soil used by cats. These carry germs that can cause birth defects in the baby.  Take your prenatal vitamins.  Try taking a stool softener (if your caregiver approves) if you develop constipation. Eat more high-fiber foods, such as fresh vegetables or fruit and whole grains. Drink plenty of fluids to keep your urine clear or pale yellow.  Take warm sitz baths to soothe any pain or discomfort caused by hemorrhoids. Use hemorrhoid cream if your caregiver approves.  If you develop varicose veins, wear support hose. Elevate your feet for 15 minutes, 3-4 times a day. Limit salt in your diet.  Avoid heavy lifting, wear low heal shoes, and practice good posture.  Rest a lot with your legs elevated if you have leg cramps or low back pain.  Visit your dentist if you have not gone during your pregnancy. Use a soft toothbrush to brush your teeth and be gentle when you floss.  A sexual relationship may be continued unless your caregiver directs you otherwise.  Do not travel far distances unless it is absolutely necessary and only with the approval  of your caregiver.  Take prenatal classes to understand, practice, and ask questions about the labor and delivery.  Make a trial run to the hospital.  Pack your hospital bag.  Prepare the baby's nursery.  Continue to go to all your prenatal visits as directed by your caregiver. SEEK MEDICAL CARE IF:  You are unsure if you are in labor or if your water has broken.  You have dizziness.  You have mild pelvic cramps, pelvic pressure, or nagging pain in your abdominal area.  You have persistent nausea, vomiting, or diarrhea.  You have a bad smelling vaginal discharge.  You have pain with urination. SEEK IMMEDIATE MEDICAL CARE IF:   You have a fever.  You are leaking fluid from your vagina.  You have spotting or bleeding from your vagina.  You have severe abdominal cramping or pain.  You have rapid weight loss or gain.  You have shortness of breath with chest pain.  You notice sudden or extreme swelling   of your face, hands, ankles, feet, or legs.  You have not felt your baby move in over an hour.  You have severe headaches that do not go away with medicine.  You have vision changes. Document Released: 03/15/2001 Document Revised: 03/26/2013 Document Reviewed: 05/22/2012 [employer] Patient Information 2015 [employer]. This information is not intended to replace advice given to you by your health care provider. Make sure you discuss any questions you have with your health care provider.  

## 2014-09-02 ENCOUNTER — Ambulatory Visit (INDEPENDENT_AMBULATORY_CARE_PROVIDER_SITE_OTHER): Payer: 59 | Admitting: Obstetrics & Gynecology

## 2014-09-02 VITALS — BP 129/76 | HR 91 | Wt 250.0 lb

## 2014-09-02 DIAGNOSIS — O1493 Unspecified pre-eclampsia, third trimester: Secondary | ICD-10-CM

## 2014-09-02 DIAGNOSIS — Z3483 Encounter for supervision of other normal pregnancy, third trimester: Secondary | ICD-10-CM

## 2014-09-02 MED ORDER — CYCLOBENZAPRINE HCL 10 MG PO TABS: 10.0000 mg | ORAL_TABLET | Freq: Three times a day (TID) | ORAL | Status: DC | PRN

## 2014-09-02 NOTE — Progress Notes (Signed)
Rt lower back pain and pain under Rt breast

## 2014-09-02 NOTE — Addendum Note (Signed)
Addended by: Asencion Islam on: 09/02/2014 03:48 PM   Modules accepted: Orders

## 2014-09-02 NOTE — Progress Notes (Signed)
Mild preeclampsia.  2x week testing.  AFI tomorrow with MFm and growth.  LAbs today.  Has headaches.  Worse with movement, some nausea, light makes worse.  Sounds more migraine than preeclampsia headache especially with chronicity.  Will try flexerill.  Refer to Laurine Blazer as well for headache evaluation.

## 2014-09-03 ENCOUNTER — Encounter (HOSPITAL_COMMUNITY): Payer: Self-pay

## 2014-09-03 ENCOUNTER — Telehealth: Payer: Self-pay | Admitting: *Deleted

## 2014-09-03 ENCOUNTER — Ambulatory Visit (HOSPITAL_COMMUNITY)
Admission: RE | Admit: 2014-09-03 | Discharge: 2014-09-03 | Disposition: A | Discharge status: Home / Self Care | Payer: 59 | Source: Ambulatory Visit | Attending: Advanced Practice Midwife | Admitting: Advanced Practice Midwife

## 2014-09-03 DIAGNOSIS — Z3A33 33 weeks gestation of pregnancy: Secondary | ICD-10-CM | POA: Insufficient documentation

## 2014-09-03 DIAGNOSIS — O3421 Maternal care for scar from previous cesarean delivery: Secondary | ICD-10-CM | POA: Diagnosis not present

## 2014-09-03 DIAGNOSIS — O09293 Supervision of pregnancy with other poor reproductive or obstetric history, third trimester: Secondary | ICD-10-CM | POA: Diagnosis not present

## 2014-09-03 DIAGNOSIS — O352XX0 Maternal care for (suspected) hereditary disease in fetus, not applicable or unspecified: Secondary | ICD-10-CM | POA: Insufficient documentation

## 2014-09-03 DIAGNOSIS — O133 Gestational [pregnancy-induced] hypertension without significant proteinuria, third trimester: Secondary | ICD-10-CM

## 2014-09-03 DIAGNOSIS — O358XX0 Maternal care for other (suspected) fetal abnormality and damage, not applicable or unspecified: Secondary | ICD-10-CM | POA: Insufficient documentation

## 2014-09-03 LAB — CBC
HEMATOCRIT: 31.1 % — AB (ref 36.0–46.0)
HEMOGLOBIN: 10.4 g/dL — AB (ref 12.0–15.0)
MCH: 28.7 pg (ref 26.0–34.0)
MCHC: 33.4 g/dL (ref 30.0–36.0)
MCV: 85.7 fL (ref 78.0–100.0)
MPV: 10.1 fL (ref 8.6–12.4)
Platelets: 207 10*3/uL (ref 150–400)
RBC: 3.63 MIL/uL — ABNORMAL LOW (ref 3.87–5.11)
RDW: 16.1 % — AB (ref 11.5–15.5)
WBC: 9 10*3/uL (ref 4.0–10.5)

## 2014-09-03 LAB — COMPREHENSIVE METABOLIC PANEL
ALT: 13 U/L (ref 0–35)
AST: 18 U/L (ref 0–37)
Albumin: 3.3 g/dL — ABNORMAL LOW (ref 3.5–5.2)
Alkaline Phosphatase: 75 U/L (ref 39–117)
BILIRUBIN TOTAL: 0.3 mg/dL (ref 0.2–1.2)
BUN: 10 mg/dL (ref 6–23)
CHLORIDE: 106 meq/L (ref 96–112)
CO2: 19 meq/L (ref 19–32)
Calcium: 8.4 mg/dL (ref 8.4–10.5)
Creat: 0.53 mg/dL (ref 0.50–1.10)
GLUCOSE: 96 mg/dL (ref 70–99)
Potassium: 4.2 mEq/L (ref 3.5–5.3)
Sodium: 136 mEq/L (ref 135–145)
Total Protein: 6.2 g/dL (ref 6.0–8.3)

## 2014-09-03 NOTE — Telephone Encounter (Signed)
-----   Message from Guss Bunde, MD sent at 09/03/2014 10:14 AM EDT ----- Call the patient and let them know their lab results are normal.  Thanks!!

## 2014-09-03 NOTE — Telephone Encounter (Signed)
Pt notified of normal blood work from yesterday

## 2014-09-05 ENCOUNTER — Ambulatory Visit (INDEPENDENT_AMBULATORY_CARE_PROVIDER_SITE_OTHER): Payer: 59 | Admitting: *Deleted

## 2014-09-05 VITALS — BP 136/77 | HR 100 | Wt 252.0 lb

## 2014-09-05 DIAGNOSIS — O1403 Mild to moderate pre-eclampsia, third trimester: Secondary | ICD-10-CM | POA: Diagnosis not present

## 2014-09-05 DIAGNOSIS — O1493 Unspecified pre-eclampsia, third trimester: Secondary | ICD-10-CM

## 2014-09-05 LAB — CULTURE, URINE COMPREHENSIVE: Colony Count: 25000

## 2014-09-05 NOTE — Progress Notes (Signed)
Has had 2 episodes of light pink when she goes to [facility]

## 2014-09-09 ENCOUNTER — Ambulatory Visit (INDEPENDENT_AMBULATORY_CARE_PROVIDER_SITE_OTHER): Payer: 59 | Admitting: Obstetrics & Gynecology

## 2014-09-09 VITALS — BP 137/81 | Wt 254.0 lb

## 2014-09-09 DIAGNOSIS — O1403 Mild to moderate pre-eclampsia, third trimester: Secondary | ICD-10-CM | POA: Diagnosis not present

## 2014-09-09 DIAGNOSIS — Z3A34 34 weeks gestation of pregnancy: Secondary | ICD-10-CM | POA: Diagnosis not present

## 2014-09-09 DIAGNOSIS — O09293 Supervision of pregnancy with other poor reproductive or obstetric history, third trimester: Secondary | ICD-10-CM | POA: Diagnosis not present

## 2014-09-09 DIAGNOSIS — O09299 Supervision of pregnancy with other poor reproductive or obstetric history, unspecified trimester: Secondary | ICD-10-CM

## 2014-09-09 MED ORDER — BETAMETHASONE SOD PHOS & ACET 6 (3-3) MG/ML IJ SUSP
12.0000 mg | INTRAMUSCULAR | Status: AC
  Administered 2014-09-09: 12 mg via INTRAMUSCULAR

## 2014-09-09 NOTE — Progress Notes (Signed)
Routine visit. She reports good FM. Denies VB, ROM, or contractions. She is having blurred vision for the last week or so along with headaches. No change in their quality/duration. I will give her betamethasone today and tomorrow, start a 24 hour urine, CBC, Cmet. Rec bedrest and pre eclampsia precautions/worsening discussed.

## 2014-09-09 NOTE — Progress Notes (Signed)
NST-Reactive    AFI-12      Headaches bad the last 2-3 days  Betamethasone 12 mg given today IM.  Pt does have and allergy to Decadron once but states that she has gotten the Betamethasone with every pregnancy.  Dr Hulan Fray aware and medication was given IM

## 2014-09-10 ENCOUNTER — Ambulatory Visit (INDEPENDENT_AMBULATORY_CARE_PROVIDER_SITE_OTHER): Payer: 59 | Admitting: *Deleted

## 2014-09-10 VITALS — BP 131/71 | HR 117 | Resp 16

## 2014-09-10 DIAGNOSIS — N390 Urinary tract infection, site not specified: Secondary | ICD-10-CM

## 2014-09-10 DIAGNOSIS — Z8751 Personal history of pre-term labor: Secondary | ICD-10-CM

## 2014-09-10 MED ORDER — BETAMETHASONE SOD PHOS & ACET 6 (3-3) MG/ML IJ SUSP
12.0000 mg | Freq: Once | INTRAMUSCULAR | Status: AC
  Administered 2014-09-10: 12 mg via INTRAMUSCULAR

## 2014-09-10 NOTE — Progress Notes (Signed)
Pt here for 2nd Betamethasone injection.  She has turned in her 24 hr urine and labs drawn.

## 2014-09-11 ENCOUNTER — Telehealth: Payer: Self-pay | Admitting: *Deleted

## 2014-09-11 DIAGNOSIS — O133 Gestational [pregnancy-induced] hypertension without significant proteinuria, third trimester: Secondary | ICD-10-CM

## 2014-09-11 LAB — CBC
HCT: 29.6 % — ABNORMAL LOW (ref 36.0–46.0)
Hemoglobin: 10.3 g/dL — ABNORMAL LOW (ref 12.0–15.0)
MCH: 29.6 pg (ref 26.0–34.0)
MCHC: 34.8 g/dL (ref 30.0–36.0)
MCV: 85.1 fL (ref 78.0–100.0)
MPV: 10.2 fL (ref 8.6–12.4)
PLATELETS: 217 10*3/uL (ref 150–400)
RBC: 3.48 MIL/uL — AB (ref 3.87–5.11)
RDW: 16.2 % — ABNORMAL HIGH (ref 11.5–15.5)
WBC: 11.7 10*3/uL — ABNORMAL HIGH (ref 4.0–10.5)

## 2014-09-11 LAB — COMPREHENSIVE METABOLIC PANEL
ALBUMIN: 3.2 g/dL — AB (ref 3.5–5.2)
ALT: 12 U/L (ref 0–35)
AST: 12 U/L (ref 0–37)
Alkaline Phosphatase: 90 U/L (ref 39–117)
BUN: 13 mg/dL (ref 6–23)
CALCIUM: 8.3 mg/dL — AB (ref 8.4–10.5)
CO2: 17 meq/L — AB (ref 19–32)
Chloride: 106 mEq/L (ref 96–112)
Creat: 0.62 mg/dL (ref 0.50–1.10)
Glucose, Bld: 169 mg/dL — ABNORMAL HIGH (ref 70–99)
POTASSIUM: 4.1 meq/L (ref 3.5–5.3)
SODIUM: 134 meq/L — AB (ref 135–145)
TOTAL PROTEIN: 6.1 g/dL (ref 6.0–8.3)
Total Bilirubin: 0.2 mg/dL (ref 0.2–1.2)

## 2014-09-11 LAB — PROTEIN, URINE, 24 HOUR
PROTEIN 24H UR: 629 mg/d — AB (ref <150)
Protein, Urine: 37 mg/dL — ABNORMAL HIGH (ref 5–24)

## 2014-09-11 NOTE — Telephone Encounter (Signed)
Pt notified of 24 hour urine results which are elevated.  Per Dr Hulan Fray appt made with MFM tomorrow @ 11:00.

## 2014-09-12 ENCOUNTER — Encounter (HOSPITAL_COMMUNITY): Payer: Self-pay | Admitting: *Deleted

## 2014-09-12 ENCOUNTER — Encounter (HOSPITAL_COMMUNITY): Payer: Self-pay

## 2014-09-12 ENCOUNTER — Inpatient Hospital Stay (HOSPITAL_COMMUNITY)
Admission: AD | Admit: 2014-09-12 | Discharge: 2014-09-16 | DRG: 774 | Disposition: A | Discharge status: Home / Self Care | Payer: 59 | Source: Ambulatory Visit | Attending: Obstetrics and Gynecology | Admitting: Obstetrics and Gynecology

## 2014-09-12 ENCOUNTER — Other Ambulatory Visit: Payer: 59

## 2014-09-12 ENCOUNTER — Ambulatory Visit (HOSPITAL_COMMUNITY)
Admission: RE | Admit: 2014-09-12 | Discharge: 2014-09-12 | Disposition: A | Discharge status: Home / Self Care | Payer: 59 | Source: Ambulatory Visit | Attending: Obstetrics & Gynecology | Admitting: Obstetrics & Gynecology

## 2014-09-12 DIAGNOSIS — Z6841 Body Mass Index (BMI) 40.0 and over, adult: Secondary | ICD-10-CM | POA: Diagnosis not present

## 2014-09-12 DIAGNOSIS — Z98891 History of uterine scar from previous surgery: Secondary | ICD-10-CM

## 2014-09-12 DIAGNOSIS — E669 Obesity, unspecified: Secondary | ICD-10-CM | POA: Diagnosis present

## 2014-09-12 DIAGNOSIS — O1413 Severe pre-eclampsia, third trimester: Secondary | ICD-10-CM | POA: Diagnosis present

## 2014-09-12 DIAGNOSIS — Z8249 Family history of ischemic heart disease and other diseases of the circulatory system: Secondary | ICD-10-CM

## 2014-09-12 DIAGNOSIS — O99214 Obesity complicating childbirth: Secondary | ICD-10-CM | POA: Diagnosis present

## 2014-09-12 DIAGNOSIS — Z3A34 34 weeks gestation of pregnancy: Secondary | ICD-10-CM | POA: Diagnosis not present

## 2014-09-12 DIAGNOSIS — Z833 Family history of diabetes mellitus: Secondary | ICD-10-CM | POA: Diagnosis not present

## 2014-09-12 DIAGNOSIS — Z8279 Family history of other congenital malformations, deformations and chromosomal abnormalities: Secondary | ICD-10-CM

## 2014-09-12 DIAGNOSIS — O3421 Maternal care for scar from previous cesarean delivery: Secondary | ICD-10-CM | POA: Diagnosis present

## 2014-09-12 DIAGNOSIS — O09523 Supervision of elderly multigravida, third trimester: Secondary | ICD-10-CM

## 2014-09-12 DIAGNOSIS — O1403 Mild to moderate pre-eclampsia, third trimester: Secondary | ICD-10-CM | POA: Insufficient documentation

## 2014-09-12 DIAGNOSIS — O09892 Supervision of other high risk pregnancies, second trimester: Secondary | ICD-10-CM

## 2014-09-12 DIAGNOSIS — O09299 Supervision of pregnancy with other poor reproductive or obstetric history, unspecified trimester: Secondary | ICD-10-CM

## 2014-09-12 DIAGNOSIS — O1410 Severe pre-eclampsia, unspecified trimester: Secondary | ICD-10-CM | POA: Diagnosis present

## 2014-09-12 DIAGNOSIS — O0993 Supervision of high risk pregnancy, unspecified, third trimester: Secondary | ICD-10-CM

## 2014-09-12 DIAGNOSIS — Z8632 Personal history of gestational diabetes: Secondary | ICD-10-CM

## 2014-09-12 LAB — COMPREHENSIVE METABOLIC PANEL
ALT: 14 U/L (ref 14–54)
AST: 20 U/L (ref 15–41)
Albumin: 2.9 g/dL — ABNORMAL LOW (ref 3.5–5.0)
Alkaline Phosphatase: 91 U/L (ref 38–126)
Anion gap: 9 (ref 5–15)
BILIRUBIN TOTAL: 0.4 mg/dL (ref 0.3–1.2)
BUN: 15 mg/dL (ref 6–20)
CALCIUM: 8.1 mg/dL — AB (ref 8.9–10.3)
CHLORIDE: 106 mmol/L (ref 101–111)
CO2: 20 mmol/L — AB (ref 22–32)
Creatinine, Ser: 0.63 mg/dL (ref 0.44–1.00)
GFR calc Af Amer: >60 mL/min (ref >60)
Glucose, Bld: 73 mg/dL (ref 65–99)
POTASSIUM: 4 mmol/L (ref 3.5–5.1)
Sodium: 135 mmol/L (ref 135–145)
TOTAL PROTEIN: 6.3 g/dL — AB (ref 6.5–8.1)

## 2014-09-12 LAB — URINE MICROSCOPIC-ADD ON

## 2014-09-12 LAB — CBC
HEMATOCRIT: 29.7 % — AB (ref 36.0–46.0)
Hemoglobin: 10 g/dL — ABNORMAL LOW (ref 12.0–15.0)
MCH: 28.7 pg (ref 26.0–34.0)
MCHC: 33.7 g/dL (ref 30.0–36.0)
MCV: 85.1 fL (ref 78.0–100.0)
Platelets: 208 10*3/uL (ref 150–400)
RBC: 3.49 MIL/uL — AB (ref 3.87–5.11)
RDW: 15.8 % — ABNORMAL HIGH (ref 11.5–15.5)
WBC: 11.5 10*3/uL — AB (ref 4.0–10.5)

## 2014-09-12 LAB — URINALYSIS, ROUTINE W REFLEX MICROSCOPIC
BILIRUBIN URINE: NEGATIVE
GLUCOSE, UA: NEGATIVE mg/dL
Ketones, ur: 15 mg/dL — AB
LEUKOCYTES UA: NEGATIVE
NITRITE: NEGATIVE
Protein, ur: NEGATIVE mg/dL
Specific Gravity, Urine: 1.025 (ref 1.005–1.030)
Urobilinogen, UA: 0.2 mg/dL (ref 0.0–1.0)
pH: 5.5 (ref 5.0–8.0)

## 2014-09-12 LAB — PROTEIN / CREATININE RATIO, URINE
CREATININE, URINE: 96 mg/dL
Protein Creatinine Ratio: 0.25 mg/mg{Cre} — ABNORMAL HIGH (ref 0.00–0.15)
Total Protein, Urine: 24 mg/dL

## 2014-09-12 LAB — PREPARE RBC (CROSSMATCH)

## 2014-09-12 LAB — GROUP B STREP BY PCR: GROUP B STREP BY PCR: NEGATIVE

## 2014-09-12 LAB — OB RESULTS CONSOLE GBS: GBS: NEGATIVE

## 2014-09-12 MED ORDER — MAGNESIUM SULFATE 50 % IJ SOLN
2.0000 g/h | INTRAVENOUS | Status: DC
  Administered 2014-09-13 – 2014-09-14 (×2): 2 g/h via INTRAVENOUS
  Filled 2014-09-12 (×3): qty 80

## 2014-09-12 MED ORDER — TERBUTALINE SULFATE 1 MG/ML IJ SOLN: 0.2500 mg | Freq: Once | INTRAMUSCULAR | Status: AC | PRN

## 2014-09-12 MED ORDER — OXYTOCIN BOLUS FROM INFUSION
500.0000 mL | INTRAVENOUS | Status: DC
  Administered 2014-09-13: 500 mL via INTRAVENOUS

## 2014-09-12 MED ORDER — LACTATED RINGERS IV SOLN
INTRAVENOUS | Status: DC
  Administered 2014-09-12 – 2014-09-13 (×2): via INTRAVENOUS

## 2014-09-12 MED ORDER — HYDRALAZINE HCL 20 MG/ML IJ SOLN: 10.0000 mg | Freq: Once | INTRAMUSCULAR | Status: AC | PRN

## 2014-09-12 MED ORDER — OXYTOCIN 40 UNITS IN LACTATED RINGERS INFUSION - SIMPLE MED
1.0000 m[IU]/min | INTRAVENOUS | Status: DC
Start: 2014-09-12 — End: 2014-09-13
  Administered 2014-09-12: 1 m[IU]/min via INTRAVENOUS
  Administered 2014-09-12: 6 m[IU]/min via INTRAVENOUS

## 2014-09-12 MED ORDER — OXYCODONE-ACETAMINOPHEN 5-325 MG PO TABS: 1.0000 | ORAL_TABLET | ORAL | Status: DC | PRN

## 2014-09-12 MED ORDER — OXYTOCIN 40 UNITS IN LACTATED RINGERS INFUSION - SIMPLE MED
62.5000 mL/h | INTRAVENOUS | Status: DC
  Administered 2014-09-13: 62.5 mL/h via INTRAVENOUS
  Filled 2014-09-12: qty 1000

## 2014-09-12 MED ORDER — PENICILLIN G POTASSIUM 5000000 UNITS IJ SOLR
5.0000 10*6.[IU] | Freq: Once | INTRAVENOUS | Status: AC
  Administered 2014-09-12: 5 10*6.[IU] via INTRAVENOUS
  Filled 2014-09-12: qty 5

## 2014-09-12 MED ORDER — LABETALOL HCL 5 MG/ML IV SOLN
20.0000 mg | INTRAVENOUS | Status: DC | PRN
Start: 2014-09-12 — End: 2014-09-14

## 2014-09-12 MED ORDER — ONDANSETRON HCL 4 MG/2ML IJ SOLN
4.0000 mg | Freq: Four times a day (QID) | INTRAMUSCULAR | Status: DC | PRN
  Administered 2014-09-13: 4 mg via INTRAVENOUS
  Filled 2014-09-12: qty 2

## 2014-09-12 MED ORDER — SODIUM CHLORIDE 0.9 % IV SOLN: Freq: Once | INTRAVENOUS | Status: DC

## 2014-09-12 MED ORDER — CITRIC ACID-SODIUM CITRATE 334-500 MG/5ML PO SOLN
30.0000 mL | ORAL | Status: DC | PRN
  Filled 2014-09-12: qty 30

## 2014-09-12 MED ORDER — LIDOCAINE HCL (PF) 1 % IJ SOLN
30.0000 mL | INTRAMUSCULAR | Status: DC | PRN
  Filled 2014-09-12: qty 30

## 2014-09-12 MED ORDER — MAGNESIUM SULFATE BOLUS VIA INFUSION
6.0000 g | Freq: Once | INTRAVENOUS | Status: AC
  Administered 2014-09-12: 6 g via INTRAVENOUS
  Filled 2014-09-12: qty 500

## 2014-09-12 MED ORDER — PENICILLIN G POTASSIUM 5000000 UNITS IJ SOLR
2.5000 10*6.[IU] | INTRAVENOUS | Status: DC
  Administered 2014-09-12: 2.5 10*6.[IU] via INTRAVENOUS
  Filled 2014-09-12 (×4): qty 2.5

## 2014-09-12 MED ORDER — LACTATED RINGERS IV SOLN
500.0000 mL | INTRAVENOUS | Status: DC | PRN
  Administered 2014-09-13: 500 mL via INTRAVENOUS
  Administered 2014-09-13: 300 mL via INTRAVENOUS

## 2014-09-12 MED ORDER — OXYCODONE-ACETAMINOPHEN 5-325 MG PO TABS: 2.0000 | ORAL_TABLET | ORAL | Status: DC | PRN

## 2014-09-12 MED ORDER — ACETAMINOPHEN 325 MG PO TABS
650.0000 mg | ORAL_TABLET | ORAL | Status: DC | PRN
  Administered 2014-09-12 – 2014-09-13 (×8): 650 mg via ORAL
  Filled 2014-09-12 (×8): qty 2

## 2014-09-12 NOTE — Progress Notes (Signed)
MATERNAL FETAL MEDICINE CONSULT  Patient Name: Olivia Moon Medical Record Number:  [id number] Date of Birth: 1980-09-23 Requesting Physician Name:  Emily Filbert, MD Date of Service: 09/12/2014  Chief Complaint Preeclampsia  History of Present Illness Olivia Moon was seen today secondary to preeclampia at the request of Emily Filbert, MD.  The patient is a 34 y.o. [id number],EX [redacted]w[redacted]d with an EDD of 10/21/2014, by Ultrasound dating method.  [name] was recently diagnosed with preeclampsia due to the findings of new onset hypertension and a 24 hour urine protein of 690 mg.  She reports a progressively worsening headache, that is no longer improved by taking Fioricet, RUQ pain, and a rapid increased in her pedal and hand edema.  She does not have any visual changes.  Her fetus is active.  She denies vaginal bleeding or loss of fluid.  She reports contractions and moderate to severe pelvic pressures.  She has a history of preeclampsia in each of her three successful pregnancies.     Review of Systems Pertinent items are noted in HPI.  Patient History OB History  Gravida Para Term Preterm AB SAB TAB Ectopic Multiple Living  5 3 1 2 1 1    3     # Outcome Date GA Lbr Len/2nd Weight Sex Delivery Anes PTL Lv  5 Current           4 Preterm 08/21/13 [redacted]w[redacted]d   M CS-LTranv Spinal  Y  3 SAB 2009 [redacted]w[redacted]d         2 Term 2005 [redacted]w[redacted]d  7 lb 8 oz (3.402 kg)  Vag-Spont EPI  Y  1 Preterm 2004 [redacted]w[redacted]d  4 lb 12 oz (2.155 kg) F Vag-Spont   Y      Past Medical History  Diagnosis Date  . Gestational diabetes mellitus, antepartum   . Preeclampsia     "with first pregnancy"  . Gestational diabetes   . Pregnancy induced hypertension     Past Surgical History  Procedure Laterality Date  . None    . No past surgeries    . Cesarean section N/A 08/21/2013    Procedure: CESAREAN SECTION;  Surgeon: Woodroe Mode, MD;  Location: [facility];  Service: Obstetrics;  Laterality: N/A;    History   Social History  .  Marital Status: Married    Spouse Name: N/A  . Number of Children: N/A  . Years of Education: N/A   Social History Main Topics  . Smoking status: Never Smoker   . Smokeless tobacco: Never Used  . Alcohol Use: No     Comment: before pregnancy  . Drug Use: No  . Sexual Activity: Yes    Birth Control/ Protection: None   Other Topics Concern  . None   Social History Narrative    Family History  Problem Relation Age of Onset  . Diabetes Mother   . Breast cancer Maternal Grandmother   . Hypertension Mother   . Breast cancer Maternal Aunt   . Hypertension Maternal Grandmother   . Hypertension Sister   . Diabetes Sister   . Heart disease Father   . Rheumatic fever Father   . Heart disease Paternal Grandfather    In addition, the patient has no family history of mental retardation, birth defects, or genetic diseases.  Physical Examination There were no vitals filed for this visit. General appearance - alert, well appearing, and in no distress Abdomen - soft, nontender, nondistended, no masses or organomegaly Extremities -  pedal edema 2 +  Assessment and Recommendations 1.  Preeclampsia with severe features.  Review of Ms. Runyan clearly reveals she has both the BP and proteinuria findings to meet criteria for preeclampsia.  In addition, her persistent and worsening headache indicates she has severe features.  At this gestational age current guidelines call for delivery.  Ms. Mandell wishes to have a TOLAC.  While her change of success is decreased in this situation due to the need for an induction and her early gestational age, she is still a good candidate for a TOLAC and an IOL (without prostaglandins) would be appropriate.  Dr. Roselie Awkward is aware of this plan.  Ms. Seawright will go directly to L&D from the [facility].  I spent 20 minutes with Ms. Dini today of which 50% was face-to-face counseling.  Thank you for referring Ms. Brocks to the [facility].  Please do not hesitate to  contact us with questions.   Jolyn Lent, MD

## 2014-09-12 NOTE — Plan of Care (Signed)
Problem: Phase I Progression Outcomes Goal: Other Phase I Outcomes/Goals Outcome: Completed/Met Date Met:  09/12/14 Discussed with pt signs/symptoms of worsening preeclampsia.  Also discussed reasons for and side effects of magnesium sulfate medication. RN gave pt patient education tool flyer.  Pt verbalized understanding and was able to describe s/s.       

## 2014-09-12 NOTE — Progress Notes (Addendum)
Labor Progress Note  S: comfortable, has intermittent HA which is moderately controlled with tylenol  O:  BP 133/86 mmHg  Pulse 91  Temp(Src) 98.1 F (36.7 C) (Axillary)  Resp 18  Ht 5\' 2"  (1.575 m)  Wt 253 lb (114.76 kg)  BMI 46.26 kg/m2  SpO2 98%  LMP 11/25/2012 (LMP Unknown) Cat I CVE:  1430: pitocin started for cervical ripening 2115 1/70/-3, FB placed, bloody return, pitocin stopped  A&P: 34 y.o. [id number] [redacted]w[redacted]d IOL 2/2 preE with severe features #preE: BP well controlled, continue magnesium #labor:  FB placed with some bloody return.  D/c pitocin, monitor bleeding, allow pt to eat.  Will restart pitocin in 4-6h #FWB: chart reviewed, BMZ given last month  Mancel Lardizabal ROCIO, MD 9:33 PM

## 2014-09-12 NOTE — H&P (Addendum)
LABOR ADMISSION HISTORY AND PHYSICAL  Olivia Moon is a 34 y.o. female [phone] with IUP at [redacted]w[redacted]d by 12 week ultrasound presenting for induction of labor due to severe pre-eclampsia. She has history of pre-eclampsia during her previous two pregnancies. She was found to have new onset hypertension at 31 weeks without proteinuria. At her OB visit this week, she had persistent headache along with blurry vision. She was given betamethasone x 2 and laboratory evaluation obtain. 24 hour urine protein was elevated at  690 mg. She continues to have worsening headache. She does have chronic headaches that previously were relieved with Fioricet but this is no longer working. She also has some RUQ pain and notes significantly increased swelling her her hands and feet. She does not have blurry vision at the time of admission. She has been feeling contractions intermittently and pelvic pressure. She was seem by MFM today and admitted for induction of labor. She has history of c-section in last delivery due to non-reassuring FHT. Her first delivery was vaginal. She desires TOLAC and consent forms had previously been signed.  She reports +FMs, No LOF, no VB. She plans on breast feeding. She request condoms for birth control.  Dating: By 12 week ultrasound --->  Estimated Date of Delivery: 10/21/14  Sono:    @[redacted]w[redacted]d , CWD, normal anatomy, cephalic presentation,   2703J, 79% EFW Fetal echocardiogram 2/19 - normal cardiac anatomy  Prenatal History/Complications:  Clinic CNM with some MD visits Prenatal Labs  Dating 12 week [place] Blood type: --/--/B POS (05/19 1025)  Genetic Screen 1 Screen: NT 2.5 (91%ile) AFP: nml NIPS: Panorama negative per patient Antibody:NEG (05/19 1025)  Anatomic [place] normal. Repeat growth 6 wks (late April) Rubella: Nonimmune  GTT Early: 176 3 hr 76-157-155-64 Third trimester: 132 RPR: NON REAC (03/23 1030)   TDaP vaccine 07/25/14 HBsAg: Negative  Flu vaccine  05/09/14 HIV: NON REACTIVE (03/23 1030)   GBS  GBS:   Contraception NFP/condoms Pap: Neg; GC/CT neg x 05 Apr 2014  Baby Food Breast   Circumcision    Pediatrician [place] in [facility]   Support Person FOB       Past Medical History: Past Medical History  Diagnosis Date  . Gestational diabetes mellitus, antepartum   . Preeclampsia     "with first pregnancy"  . Gestational diabetes   . Pregnancy induced hypertension     Past Surgical History: Past Surgical History  Procedure Laterality Date  . None    . No past surgeries    . Cesarean section N/A 08/21/2013    Procedure: CESAREAN SECTION;  Surgeon: Woodroe Mode, MD;  Location: [facility];  Service: Obstetrics;  Laterality: N/A;    Obstetrical History: OB History    Gravida Para Term Preterm AB TAB SAB Ectopic Multiple Living   5 3 1 2 1  1   3       Social History: History   Social History  . Marital Status: Married    Spouse Name: N/A  . Number of Children: N/A  . Years of Education: N/A   Social History Main Topics  . Smoking status: Never Smoker   . Smokeless tobacco: Never Used  . Alcohol Use: No     Comment: before pregnancy  . Drug Use: No  . Sexual Activity: Yes    Birth Control/ Protection: None   Other Topics Concern  . None   Social History Narrative    Family History: Family History  Problem Relation  Age of Onset  . Diabetes Mother   . Breast cancer Maternal Grandmother   . Hypertension Mother   . Breast cancer Maternal Aunt   . Hypertension Maternal Grandmother   . Hypertension Sister   . Diabetes Sister   . Heart disease Father   . Rheumatic fever Father   . Heart disease Paternal Grandfather     Allergies: Allergies  Allergen Reactions  . Aspirin Anaphylaxis  . Decadron [Dexamethasone] Shortness Of Breath and Itching    Prescriptions prior to admission  Medication Sig Dispense Refill Last Dose  . acetaminophen (TYLENOL) 500 MG tablet Take 1,000 mg by  mouth every 6 (six) hours as needed for headache.    Taking  . butalbital-acetaminophen-caffeine (ESGIC PLUS) 50-500-40 MG per tablet Take 1-2 tablets by mouth every 4 (four) hours as needed for pain or headache. 30 tablet 0 Taking  . cyclobenzaprine (FLEXERIL) 10 MG tablet Take 1 tablet (10 mg total) by mouth every 8 (eight) hours as needed for muscle spasms. 30 tablet 1 Taking  . Prenatal Vit-Fe Fumarate-FA (PRENATAL MULTIVITAMIN) TABS tablet Take 1 tablet by mouth daily at 12 noon.   Taking     Review of Systems   All systems reviewed and negative except as stated in HPI  Blood pressure 127/86, pulse 100, temperature 98.3 F (36.8 C), temperature source Oral, resp. rate 16, height 5\' 2"  (1.575 m), weight 114.76 kg (253 lb), last menstrual period 11/25/2012, currently breastfeeding. General appearance: alert, cooperative, mild distress and due to headache Lungs: clear to auscultation bilaterally Heart: regular rate and rhythm Abdomen: soft, non-tender; bowel sounds normal Pelvic:  Extremities: Homans sign is negative, no sign of DVT DTR's Presentation: cephalic, confirmed by bedside ultrasound Fetal monitoring    Fetal Heart Rate A     Mode  External filed at 09/12/2014 1234    Baseline Rate (A)  140 bpm filed at 09/12/2014 1410    Variability  6-25 BPM filed at 09/12/2014 1410    Accelerations  15 x 15 filed at 09/12/2014 1410    Decelerations  None filed at 09/12/2014 1410       Uterine activityNone   Dilation: Closed Effacement (%): 50 Station: Ballotable Presentation: Vertex Exam by:: Clemmons,CNM   Prenatal labs: ABO, Rh: B/POS/-- (01/08 1121) Antibody: NEG (01/08 1121) Rubella:   RPR: NON REAC (04/22 0849)  HBsAg: NEGATIVE (01/08 1121)  HIV: NONREACTIVE (04/22 0849)  GBS:    1 hr Glucola normal Genetic screening  normal Anatomy US normal  Prenatal Transfer Tool  Maternal Diabetes: No Genetic Screening: Normal Maternal Ultrasounds/Referrals:  Normal Fetal Ultrasounds or other Referrals:  None Maternal Substance Abuse:  No Significant Maternal Medications:  None Significant Maternal Lab Results: None  No results found for this or any previous visit (from the past 24 hour(s)).  Patient Active Problem List   Diagnosis Date Noted  . [redacted] weeks gestation of pregnancy   . Nuchal fold thickening determined by ultrasound 05/12/2014  . History of preterm delivery 05/09/2014  . Short interval between pregnancies affecting pregnancy in second trimester, antepartum 04/11/2014  . Hx of preeclampsia, prior pregnancy, currently pregnant 04/11/2014  . Family history of congenital heart defect 04/11/2014  . S/P C-section 08/21/2013  . Supervision of high-risk pregnancy 06/03/2013  . Pregnancy complicated by previous preterm labor in first trimester 01/23/2013  . Obesity (BMI 30-39.9) 01/23/2013    Assessment: Olivia Moon is a 34 y.o. [id number] at [redacted]w[redacted]d here for induction of labor due to preeclampsia  with severe features.   #IOL: Cervix closed on exam. Unable to use cytotec due to preeclampsia. Will start pitocin 1x1 and monitor for change # Pre-eclampsia. Start Mg infusion. Monitor for severe range BP. Hyralazine and labetalol for BP control. Neonatology consulted #Pain: none #FWB: Category I at time of admission #ID:  GBS unknown. PCR sent. Penicillin until PCR negative #MOF: breast #MOC: OCP  Patient history, exam, assessment and plan discussed with Yvonne Kendall, CNM and Dr. Marlynn Perking Nani Gasser 09/12/2014, 1:09 PM   I examined and counseled the patient and reviewed Dr Nitsche's note. US showed cephalic presentation. Cervical ripening and induction for severe preeclampsia.  Woodroe Mode, MD 09/12/2014

## 2014-09-13 ENCOUNTER — Encounter (HOSPITAL_COMMUNITY): Payer: Self-pay | Admitting: *Deleted

## 2014-09-13 ENCOUNTER — Inpatient Hospital Stay (HOSPITAL_COMMUNITY): Payer: 59 | Admitting: Anesthesiology

## 2014-09-13 DIAGNOSIS — O3421 Maternal care for scar from previous cesarean delivery: Secondary | ICD-10-CM

## 2014-09-13 DIAGNOSIS — Z3A34 34 weeks gestation of pregnancy: Secondary | ICD-10-CM

## 2014-09-13 DIAGNOSIS — O1413 Severe pre-eclampsia, third trimester: Secondary | ICD-10-CM

## 2014-09-13 LAB — CULTURE, URINE COMPREHENSIVE

## 2014-09-13 LAB — CBC
HCT: 28.4 % — ABNORMAL LOW (ref 36.0–46.0)
HCT: 30.6 % — ABNORMAL LOW (ref 36.0–46.0)
HEMATOCRIT: 29.7 % — AB (ref 36.0–46.0)
HEMOGLOBIN: 9.4 g/dL — AB (ref 12.0–15.0)
Hemoglobin: 10.1 g/dL — ABNORMAL LOW (ref 12.0–15.0)
Hemoglobin: 10 g/dL — ABNORMAL LOW (ref 12.0–15.0)
MCH: 28.3 pg (ref 26.0–34.0)
MCH: 28.9 pg (ref 26.0–34.0)
MCH: 28.6 pg (ref 26.0–34.0)
MCHC: 33.1 g/dL (ref 30.0–36.0)
MCHC: 34 g/dL (ref 30.0–36.0)
MCHC: 32.7 g/dL (ref 30.0–36.0)
MCV: 85.5 fL (ref 78.0–100.0)
MCV: 85.1 fL (ref 78.0–100.0)
MCV: 87.4 fL (ref 78.0–100.0)
Platelets: 188 10*3/uL (ref 150–400)
Platelets: 183 10*3/uL (ref 150–400)
Platelets: 169 10*3/uL (ref 150–400)
RBC: 3.32 MIL/uL — AB (ref 3.87–5.11)
RBC: 3.49 MIL/uL — ABNORMAL LOW (ref 3.87–5.11)
RBC: 3.5 MIL/uL — ABNORMAL LOW (ref 3.87–5.11)
RDW: 15.9 % — ABNORMAL HIGH (ref 11.5–15.5)
RDW: 16 % — AB (ref 11.5–15.5)
RDW: 16.1 % — ABNORMAL HIGH (ref 11.5–15.5)
WBC: 9.2 10*3/uL (ref 4.0–10.5)
WBC: 10.7 10*3/uL — ABNORMAL HIGH (ref 4.0–10.5)
WBC: 12.9 10*3/uL — ABNORMAL HIGH (ref 4.0–10.5)

## 2014-09-13 LAB — RPR: RPR Ser Ql: NONREACTIVE

## 2014-09-13 MED ORDER — MISOPROSTOL 200 MCG PO TABS
ORAL_TABLET | ORAL | Status: AC
  Administered 2014-09-13: 1000 ug
  Filled 2014-09-13: qty 5

## 2014-09-13 MED ORDER — FENTANYL 2.5 MCG/ML BUPIVACAINE 1/10 % EPIDURAL INFUSION (WH - ANES)
14.0000 mL/h | INTRAMUSCULAR | Status: DC | PRN
Start: 2014-09-13 — End: 2014-09-14
  Administered 2014-09-13 (×2): 14 mL/h via EPIDURAL
  Filled 2014-09-13 (×2): qty 125

## 2014-09-13 MED ORDER — SODIUM BICARBONATE 8.4 % IV SOLN
INTRAVENOUS | Status: DC | PRN
  Administered 2014-09-13: 3 mL via EPIDURAL

## 2014-09-13 MED ORDER — DIPHENHYDRAMINE HCL 50 MG/ML IJ SOLN: 12.5000 mg | INTRAMUSCULAR | Status: DC | PRN

## 2014-09-13 MED ORDER — LIDOCAINE HCL (PF) 1 % IJ SOLN
INTRAMUSCULAR | Status: DC | PRN
  Administered 2014-09-13: 4 mL
  Administered 2014-09-13: 6 mL

## 2014-09-13 MED ORDER — PHENYLEPHRINE 40 MCG/ML (10ML) SYRINGE FOR IV PUSH (FOR BLOOD PRESSURE SUPPORT)
80.0000 ug | PREFILLED_SYRINGE | INTRAVENOUS | Status: DC | PRN
  Administered 2014-09-13: 80 ug via INTRAVENOUS
  Filled 2014-09-13: qty 2
  Filled 2014-09-13: qty 20

## 2014-09-13 MED ORDER — EPHEDRINE 5 MG/ML INJ
10.0000 mg | INTRAVENOUS | Status: DC | PRN
  Filled 2014-09-13: qty 2

## 2014-09-13 MED ORDER — OXYTOCIN 40 UNITS IN LACTATED RINGERS INFUSION - SIMPLE MED
1.0000 m[IU]/min | INTRAVENOUS | Status: DC
Start: 2014-09-13 — End: 2014-09-14
  Administered 2014-09-13: 2 m[IU]/min via INTRAVENOUS
  Administered 2014-09-13: 4 m[IU]/min via INTRAVENOUS

## 2014-09-13 NOTE — Anesthesia Procedure Notes (Signed)

## 2014-09-13 NOTE — Progress Notes (Signed)
Olivia Moon is a 34 y.o. [id number] at [redacted]w[redacted]d   Subjective: Contractions becoming stronger. Wants to wait for epidural placement. Still have a headache rated at 3.  Objective: BP 127/63 mmHg  Pulse 84  Temp(Src) 98.8 F (37.1 C) (Oral)  Resp 16  Ht 5\' 2"  (1.575 m)  Wt 114.76 kg (253 lb)  BMI 46.26 kg/m2  SpO2 98%  LMP 11/25/2012 (LMP Unknown) I/O last 3 completed shifts: In: 5639.1 [P.O.:2660; I.V.:2629.1; IV Piggyback:350] Out: 7209 [Urine:2310] Total I/O In: 1365.7 [P.O.:800; I.V.:565.7] Out: 675 [Urine:675]  FHT:  FHR: 145 bpm, variability: moderate,  accelerations:  Present,  decelerations:  Absent UC:   regular, every 2-3 minutes SVE:   Dilation: 4 Effacement (%): 50 Station: -2 Exam by:: Cecille Rubin, CNM  Labs: Lab Results  Component Value Date   WBC 9.2 09/13/2014   HGB 9.4* 09/13/2014   HCT 28.4* 09/13/2014   MCV 85.5 09/13/2014   PLT 188 09/13/2014    Assessment / Plan: Induction of labor due to preeclampsia,  progressing well on pitocin  Labor: Progressing on Pitocin, will continue to increase then AROM Preeclampsia:  on magnesium sulfate, no signs or symptoms of toxicity and intake and ouput balanced Fetal Wellbeing:  Category I Pain Control:  Labor support without medications I/D:  Tolac Anticipated MOD:  Tolac  Clemmons,Lori Grissett 09/13/2014, 1:43 PM

## 2014-09-13 NOTE — Progress Notes (Signed)
Olivia Moon is a 34 y.o. [id number] at [redacted]w[redacted]d by  admitted for Preeclampsia with Severe Features   Subjective: Pt's foley bulb fell out. She is comfortable right now and will be planning on getting an epidural.   Objective: BP 112/66 mmHg  Pulse 91  Temp(Src) 98.1 F (36.7 C) (Oral)  Resp 18  Ht 5\' 2"  (1.575 m)  Wt 114.76 kg (253 lb)  BMI 46.26 kg/m2  SpO2 98%  LMP 11/25/2012 (LMP Unknown) I/O last 3 completed shifts: In: 5414.1 [P.O.:2660; I.V.:2404.1; IV Piggyback:350] Out: 2310 [Urine:2310] Total I/O In: 629.3 [P.O.:380; I.V.:249.3] Out: 300 [Urine:300]  FHT:  Category 1 SVE:   Dilation: 1 Effacement (%): 70 Station: -3 Exam by:: Dr. Deniece Ree  Labs: Lab Results  Component Value Date   WBC 11.5* 09/12/2014   HGB 10.0* 09/12/2014   HCT 29.7* 09/12/2014   MCV 85.1 09/12/2014   PLT 208 09/12/2014     Clemmons,Lori Grissett 09/13/2014, 9:36 AM

## 2014-09-13 NOTE — Anesthesia Preprocedure Evaluation (Signed)
Anesthesia Evaluation  Patient identified by MRN, date of birth, ID band Patient awake    Reviewed: Allergy & Precautions, H&P , Patient's Chart, lab work & pertinent test results  Airway Mallampati: II  TM Distance: >3 FB Neck ROM: full    Dental  (+) Teeth Intact   Pulmonary  breath sounds clear to auscultation        Cardiovascular hypertension, Rhythm:regular Rate:Normal     Neuro/Psych    GI/Hepatic   Endo/Other  diabetes  Renal/GU      Musculoskeletal   Abdominal   Peds  Hematology   Anesthesia Other Findings TOLAC PIH MO  DM- Gest     Reproductive/Obstetrics (+) Pregnancy                             Anesthesia Physical Anesthesia Plan  ASA: III  Anesthesia Plan: Epidural   Post-op Pain Management:    Induction:   Airway Management Planned:   Additional Equipment:   Intra-op Plan:   Post-operative Plan:   Informed Consent: I have reviewed the patients History and Physical, chart, labs and discussed the procedure including the risks, benefits and alternatives for the proposed anesthesia with the patient or authorized representative who has indicated his/her understanding and acceptance.   Dental Advisory Given  Plan Discussed with:   Anesthesia Plan Comments: (Labs checked- platelets confirmed with RN in room. Fetal heart tracing, per RN, reported to be stable enough for sitting procedure. Discussed epidural, and patient consents to the procedure:  included risk of possible headache,backache, failed block, allergic reaction, and nerve injury. This patient was asked if she had any questions or concerns before the procedure started.)        Anesthesia Quick Evaluation

## 2014-09-14 LAB — MRSA PCR SCREENING: MRSA by PCR: NEGATIVE

## 2014-09-14 MED ORDER — LACTATED RINGERS IV SOLN
INTRAVENOUS | Status: DC
  Administered 2014-09-14: 08:00:00 via INTRAVENOUS

## 2014-09-14 MED ORDER — BENZOCAINE-MENTHOL 20-0.5 % EX AERO
1.0000 "application " | INHALATION_SPRAY | CUTANEOUS | Status: DC | PRN
  Filled 2014-09-14: qty 56

## 2014-09-14 MED ORDER — DIPHENHYDRAMINE HCL 25 MG PO CAPS: 25.0000 mg | ORAL_CAPSULE | Freq: Four times a day (QID) | ORAL | Status: DC | PRN

## 2014-09-14 MED ORDER — ZOLPIDEM TARTRATE 5 MG PO TABS: 5.0000 mg | ORAL_TABLET | Freq: Every evening | ORAL | Status: DC | PRN

## 2014-09-14 MED ORDER — ACETAMINOPHEN 325 MG PO TABS
650.0000 mg | ORAL_TABLET | ORAL | Status: DC | PRN
  Administered 2014-09-14 – 2014-09-16 (×3): 650 mg via ORAL
  Filled 2014-09-14 (×3): qty 2

## 2014-09-14 MED ORDER — SIMETHICONE 80 MG PO CHEW
80.0000 mg | CHEWABLE_TABLET | ORAL | Status: DC | PRN
Start: 2014-09-14 — End: 2014-09-16
  Filled 2014-09-14: qty 1

## 2014-09-14 MED ORDER — OXYTOCIN 40 UNITS IN LACTATED RINGERS INFUSION - SIMPLE MED: 62.5000 mL/h | INTRAVENOUS | Status: DC | PRN

## 2014-09-14 MED ORDER — WITCH HAZEL-GLYCERIN EX PADS: 1.0000 "application " | MEDICATED_PAD | CUTANEOUS | Status: DC | PRN

## 2014-09-14 MED ORDER — IBUPROFEN 600 MG PO TABS
600.0000 mg | ORAL_TABLET | Freq: Four times a day (QID) | ORAL | Status: DC
  Administered 2014-09-14 – 2014-09-16 (×10): 600 mg via ORAL
  Filled 2014-09-14 (×10): qty 1

## 2014-09-14 MED ORDER — LANOLIN HYDROUS EX OINT: TOPICAL_OINTMENT | CUTANEOUS | Status: DC | PRN

## 2014-09-14 MED ORDER — ONDANSETRON HCL 4 MG PO TABS: 4.0000 mg | ORAL_TABLET | ORAL | Status: DC | PRN

## 2014-09-14 MED ORDER — BISACODYL 10 MG RE SUPP: 10.0000 mg | Freq: Every day | RECTAL | Status: DC | PRN

## 2014-09-14 MED ORDER — SODIUM CHLORIDE 0.9 % IJ SOLN
3.0000 mL | INTRAMUSCULAR | Status: DC | PRN
  Administered 2014-09-14: 3 mL via INTRAVENOUS
  Filled 2014-09-14: qty 3

## 2014-09-14 MED ORDER — SENNOSIDES-DOCUSATE SODIUM 8.6-50 MG PO TABS
2.0000 | ORAL_TABLET | ORAL | Status: DC
  Administered 2014-09-14 – 2014-09-15 (×2): 2 via ORAL
  Filled 2014-09-14 (×3): qty 2

## 2014-09-14 MED ORDER — FLEET ENEMA 7-19 GM/118ML RE ENEM: 1.0000 | ENEMA | Freq: Every day | RECTAL | Status: DC | PRN

## 2014-09-14 MED ORDER — LIDOCAINE-PRILOCAINE 2.5-2.5 % EX CREA
TOPICAL_CREAM | Freq: Once | CUTANEOUS | Status: DC
  Filled 2014-09-14: qty 5

## 2014-09-14 MED ORDER — ONDANSETRON HCL 4 MG/2ML IJ SOLN: 4.0000 mg | INTRAMUSCULAR | Status: DC | PRN

## 2014-09-14 MED ORDER — SODIUM CHLORIDE 0.9 % IV SOLN: 250.0000 mL | INTRAVENOUS | Status: DC | PRN

## 2014-09-14 MED ORDER — PRENATAL MULTIVITAMIN CH
1.0000 | ORAL_TABLET | Freq: Every day | ORAL | Status: DC
  Administered 2014-09-14 – 2014-09-16 (×3): 1 via ORAL
  Filled 2014-09-14 (×3): qty 1

## 2014-09-14 MED ORDER — DIBUCAINE 1 % RE OINT: 1.0000 "application " | TOPICAL_OINTMENT | RECTAL | Status: DC | PRN

## 2014-09-14 MED ORDER — SODIUM CHLORIDE 0.9 % IJ SOLN
3.0000 mL | Freq: Two times a day (BID) | INTRAMUSCULAR | Status: DC
  Administered 2014-09-14 – 2014-09-15 (×2): 3 mL via INTRAVENOUS

## 2014-09-14 MED ORDER — MAGNESIUM SULFATE 50 % IJ SOLN
2.0000 g/h | INTRAVENOUS | Status: AC
  Administered 2014-09-14: 2 g/h via INTRAVENOUS
  Filled 2014-09-14 (×2): qty 80

## 2014-09-14 NOTE — Progress Notes (Signed)
Patient ID: SHAZIA MITCHENER, female   DOB: January 17, 1981, 34 y.o.   MRN: [id number] Post Partum Day 1 Subjective: no complaints, moderate lochia,  plans to breastfeed. Still with mild headache.  Objective: Blood pressure 139/81, pulse 88, temperature 98.1 F (36.7 C), temperature source Oral, resp. rate 20, height 5\' 2"  (1.575 m), weight 253 lb (114.76 kg), last menstrual period 11/25/2012, SpO2 97 %, unknown if currently breastfeeding.  Physical Exam:  General: alert, cooperative and appears stated age 68: appropriate Abdomen: +BS, soft, nontender Uterine Fundus: firm, non-tender, U-1 DVT Evaluation: No evidence of DVT seen on physical exam. Extremities: 1+ edema Skin: slight erythema from tape on back from epidural   Recent Labs  09/13/14 1602 09/13/14 2205  HGB 10.1* 10.0*  HCT 29.7* 30.6*    Assessment/Plan: Plan for discharge tomorrow and Breastfeeding  Continue Magnesium sulfate for 24 hours pp. May discontinue foley catheter.   LOS: 2 days   Haydan Mansouri S 09/14/2014, 9:48 AM

## 2014-09-14 NOTE — Progress Notes (Signed)
Patient requested for B/P cuff to be off until 2300 hrs because she tired and and would like to take a nap before going to NICU to feed her baby at 2300 hrs. Patient was listened to, supported and we will respect sleep. We will continue to monitor.

## 2014-09-14 NOTE — Lactation Note (Signed)
This note was copied from the chart of Olivia Moon. Lactation Consultation Note  Patient Name: Olivia Moon [id number]'M Date: 09/14/2014 Reason for consult: Initial assessment  With this experienced breast feeding mom. Baby is in NICU, and mom is pumping and hand expressing. She has an abundant supply of colostrum, and has had great milk supplies with her other babies. NICU bookle and lactation serivces reviewed with mom. I assisted mom with STS with her baby in the NICU - baby too sleepy to nuzzle. Mom knows to call for questions/concerns. Mom has a personal l DEP at home.   Maternal Data Formula Feeding for Exclusion: Yes (baby in NICU and mom in [place]) Reason for exclusion: Admission to Intensive Care Unit (ICU) post-partum Has patient been taught Hand Expression?: Yes Does the patient have breastfeeding experience prior to this delivery?: Yes  Feeding    LATCH Score/Interventions Latch: Too sleepy or reluctant, no latch achieved, no sucking elicited.     Type of Nipple: Everted at rest and after stimulation  Comfort (Breast/Nipple): Soft / non-tender           Lactation Tools Discussed/Used Pump Review: Setup, frequency, and cleaning;Milk Storage;Other (comment) (premie setting, hand expresion) Initiated by:: bedside RN   Consult Status Consult Status: Follow-up Date: 09/14/14 Follow-up type: In-patient    Tonna Corner 09/14/2014, 4:22 PM

## 2014-09-14 NOTE — Progress Notes (Signed)
Pt off to NICU accompanied by her husband.  VSS, patient transported off unit via wheel chair.

## 2014-09-15 NOTE — Progress Notes (Signed)
UR chart review completed.  

## 2014-09-15 NOTE — Progress Notes (Signed)
Post Partum Day 2 Subjective: no complaints, up ad lib, voiding, tolerating PO and + flatus  Objective: Blood pressure 114/57, pulse 78, temperature 98.7 F (37.1 C), temperature source Oral, resp. rate 16, height 5\' 2"  (1.575 m), weight 255 lb 11.2 oz (115.985 kg), last menstrual period 11/25/2012, SpO2 96 %, unknown if currently breastfeeding.  Physical Exam:  General: alert, cooperative, appears stated age and no distress Lochia: appropriate Uterine Fundus: firm Incision: n/a DVT Evaluation: No evidence of DVT seen on physical exam. Negative Homan's sign. No cords or calf tenderness.   Recent Labs  09/13/14 1602 09/13/14 2205  HGB 10.1* 10.0*  HCT 29.7* 30.6*    Assessment/Plan: Plan for discharge tomorrow   LOS: 3 days   LAWSON, MARIE DARLENE 09/15/2014, 7:47 AM

## 2014-09-15 NOTE — Progress Notes (Signed)
Patient and spouse returned to Unit. And settled comfortable in bed.

## 2014-09-15 NOTE — Progress Notes (Signed)
Patient returned to the Unit and was very happy with her baby's progress she stated. She reported breast feeding the baby and that the baby laches on very well. No concerns voiced at this time.

## 2014-09-15 NOTE — Progress Notes (Signed)
Patient off to NICU

## 2014-09-15 NOTE — Anesthesia Postprocedure Evaluation (Signed)
  Anesthesia Post-op Note  Patient: Olivia Moon  Procedure(s) Performed: * No procedures listed *  Patient Location: A-ICU  Anesthesia Type:Epidural  Level of Consciousness: awake  Airway and Oxygen Therapy: Patient Spontanous Breathing  Post-op Pain: mild  Post-op Assessment: Patient's Cardiovascular Status Stable and Respiratory Function Stable              Post-op Vital Signs: stable  Last Vitals:  Filed Vitals:   09/15/14 0500  BP: 114/57  Pulse: 78  Temp: 37.1 C  Resp: 16    Complications: No apparent anesthesia complications

## 2014-09-15 NOTE — Clinical Social Work Maternal (Addendum)
CLINICAL SOCIAL WORK MATERNAL/CHILD NOTE  Patient Details  Name: Olivia Moon MRN: [id number] Date of Birth: 1980/10/11  Date:  09/15/2014  Clinical Social Worker Initiating Note:  Lucita Ferrara, LCSW Date/ Time Initiated:  09/15/14/1100     Child's Name:  Olivia Moon   Legal Guardian:  [name] and [name] (parents)  Need for Interpreter:  None   Date of Referral:  09/13/14     Reason for Referral:  NICU admission   Referral Source:  NICU   Address:  [address], [place] [address]  Phone number:  [phone]   Household Members:  Minor Children, Spouse   Natural Supports (not living in the home):  Church, Immediate Family, Friends, Extended Family   Professional Supports: None   Employment: Full-time   Type of Work: [employer] and FOB reported that they both work from home   Education:  Geophysical data processor Resources:  Multimedia programmer   Other Resources:    None identified  Cultural/Religious Considerations Which May Impact Care:  None reported  Strengths:  Ability to meet basic needs , Engineer, materials , Home prepared for child    Risk Factors/Current Problems:  None   Cognitive State:  Able to Concentrate , Alert , Linear Thinking , Insightful    Mood/Affect:  Animated, Bright , Relaxed , Comfortable , Happy    CSW Assessment:  CSW met with MOB and FOB in order to introduce self, role of CSW in NICU team, and to begin to provide psychosocial support secondary to NICU admission.  MOB and FOB presented as easily engaged and receptive to the visit. MOB displayed a full range in affect and was in a pleasant mood.  MOB and FOB present as coping well with NICU admission.  They shared how they had been anticipating a premature birth due to MOB's history of pre-eclampsia and reflected upon their desire and intentions to prepare the nursery and have the hospital bag packed early in the pregnancy.  MOB and FOB shared that their  church community has also already planned to have meals delivered to their homes in order to provide them with additional support, and reported that they had already been in contact with family to help with their other children while at the hospital.  Despite their preparation for a NICU admission, MOB and FOB also recognized the emotional difficulties that have resulted since the MOB was admitted to the [facility] after delivery which resulted in less bonding/time with the infant once the infant was born.  They expressed belief that it was a short term stressor since now that the MOB is on the Women's Unit, they are able to come and go between the MOB's room and the NICU as they desire.  MOB and FOB discussed excitement secondary to infant's progress in the NICU, and expressed hope that the NICU admission will be brief due to her progress.  MOB acknowledged also need to balance hope, expectations, and reality, and expressed a goal of taking it one day at a time.   MOB denied history of postpartum depression and anxiety. She stated that each transition to the postpartum period has been coupled with unique stressors (first child was born with limited family support and while in graduate school).  She shared that she is now adjusting to having 2 children within 1 months of age, along with her oldest child who is 2 years old.  MOB and FOB endorsed feeling well supported by family, friends, and  their church community. They denied current needs at this time.  CSW reviewed range in feelings associated with a NICU admission, and highlighted symptoms of postpartum depression and anxiety.  MOB agreed to contact her medical provider if she notes onset of symptoms.   MOB denied additional questions, concerns, or needs at this time. MOB and FOB agreed to contact CSW if needs arise.   CSW Plan/Description:   1)Patient/Family Education: Perinatal mood and anxiety disorders 2)No Further Intervention Required/No Barriers to  Discharge    Sharyl Nimrod 09/15/2014, 12:15 PM

## 2014-09-15 NOTE — Progress Notes (Addendum)
Report called to Abby, RN Women's Unit.  Patient transferred to [facility] Unit.  Patient ambulated to room with assistance from husband.  Patient stable at time of transfer.

## 2014-09-16 ENCOUNTER — Encounter: Payer: 59 | Admitting: Obstetrics & Gynecology

## 2014-09-16 LAB — TYPE AND SCREEN
ABO/RH(D): B POS
Antibody Screen: NEGATIVE
UNIT DIVISION: 0
Unit division: 0

## 2014-09-16 MED ORDER — IBUPROFEN 600 MG PO TABS: 600.0000 mg | ORAL_TABLET | Freq: Four times a day (QID) | ORAL | Status: DC

## 2014-09-16 NOTE — Lactation Note (Addendum)
This note was copied from the chart of Olivia Jadon Ressler. Lactation Consultation Note  Patient Name: Olivia Moon [id number]'[id number] Date: 09/16/2014 Reason for consult: Follow-up assessment;NICU baby NICU baby 25 hours old. Mom reports that she nursed her 3 older children without any BF issues. Mom states that her milk is coming in and is beginning to transition. Mom wanted assistance latching baby at breast and to discuss how to know baby getting enough at breast. Assisted mom to latch baby in football position to left breast. Baby attempting to latch, but able to latch deeply. Fitted mom with a #20 NS and baby latch deeply with lips flanged, and suckled rhythmically with intermittent swallows. Demonstrated to mom that baby transferring milk.   Mom given LPI guidelines. Discussed LPI behavior and breastfeeding management. Enc mom to feed baby every 3 hours. Enc mom to watch baby carefully to determine when to keep trying to nurse and when to supplement. Enc parents to limit total feed time to 30 minutes. Mom requested and was given SNS system, which mom has used before. Discussed alternate feeding methods for supplementation and optimum use of each.   Mom pleased with baby's positioning and latch at breast, and baby's transferring of milk with NS. Mom aware of OP/BFSG and [place] phone line assistance after D/C. Mom aware of availability of scales and LC assist with support group and OP appointment.   Enc mom to call for assistance as needed.    Maternal Data    Feeding Feeding Type: Formula Nipple Type: Slow - flow Length of feed: 15 min  LATCH Score/Interventions Latch: Grasps breast easily, tongue down, lips flanged, rhythmical sucking.  Audible Swallowing: Spontaneous and intermittent  Type of Nipple: Everted at rest and after stimulation  Comfort (Breast/Nipple): Soft / non-tender     Hold (Positioning): Assistance needed to correctly position infant at breast and maintain  latch.  LATCH Score: 9  Lactation Tools Discussed/Used Tools: Nipple Shields Nipple shield size: 20   Consult Status Consult Status: Follow-up Date: 09/17/14 Follow-up type: In-patient    Inocente Salles 09/16/2014, 12:52 PM

## 2014-09-16 NOTE — Discharge Instructions (Signed)

## 2014-09-16 NOTE — Discharge Summary (Signed)
Obstetric Discharge Summary Reason for Admission: induction of labor Prenatal Procedures: NST, Preeclampsia and ultrasound Intrapartum Procedures: spontaneous vaginal delivery (VBAC) Postpartum Procedures: none Complications-Operative and Postpartum: none HEMOGLOBIN  Date Value Ref Range Status  09/13/2014 10.0* 12.0 - 15.0 g/dL Final   HCT  Date Value Ref Range Status  09/13/2014 30.6* 36.0 - 46.0 % Final   Brief Summary of Hospital Course: Admitted for IOL secondary to Preeclampsia with severe features.  Delivered vaginal delivery (VBAC).  Patient went to [facility] for magnesium for 24 hours.  Did well postpartum.  Blood pressure stable.  Ready for discharge to home.    Physical Exam:  General: alert, cooperative and no distress Lochia: appropriate Uterine Fundus: firm Incision: healing well, no significant drainage, no dehiscence DVT Evaluation: No evidence of DVT seen on physical exam. Negative Homan's sign. No cords or calf tenderness.  Discharge Diagnoses: Preelampsia and preterm delivery  Discharge Information: Date: 09/16/2014 Activity: pelvic rest Diet: routine Medications: PNV and Ibuprofen Condition: stable Instructions: refer to practice specific booklet Discharge to: home Follow-up Information    Follow up with [facility] [place] In 5 weeks.   Specialty:  Obstetrics and Gynecology   Why:  for postpartum visit   Contact information:   [employer], [place] [phone]      Newborn Data: Live born female  Birth Weight: 5 lb 15.6 oz (2710 g) APGAR: 8, 8  Room in with mother.  Olivia Moon 09/16/2014, 10:28 AM

## 2014-09-16 NOTE — Progress Notes (Addendum)
Pt verbalizes understanding of d/c instructions, medications, follow up appts, when to seek medical attention, and belongings policy. No IV at time of d/c. Pt being d/c to NICU to room in with baby for tonight. Pt has decided against taking the MMR vaccine at this time.  Marry Guan

## 2014-09-17 ENCOUNTER — Ambulatory Visit: Payer: Self-pay

## 2014-09-17 NOTE — Lactation Note (Signed)
This note was copied from the chart of Olivia Moon. Lactation Consultation Note  Patient Name: Olivia Moon Date: 09/17/2014 Reason for consult: Follow-up assessment;NICU baby  NICU baby 1 hours old. Parents roomed-in with baby in NICU overnight. Mom states that her breasts are starting to feel full and tight. Discussed engorgement prevention/treatment methods. Mom refitted with #24 flange and given #21 flanges with instructions. Mom also given comfort gels with instruction due to nipple soreness. Reviewed LPI behavior as it relates to infant feeding. Plan is for mom to continue to offer baby the breast first, at least every 3 hours, then supplement with EBM. Enc parents to respond to baby and how well baby tolerating feeding process, limiting total BF to 30 minutes. Enc mom to keep post-pumping after each feed in order to protect mom's milk supply. Parents aware of the importance of weight checks, and of the [facility] scales during BFSG as well. Reviewed use of NS and how to transition away from shield when no longer needed. Enc parents to move gradually to exclusive BF in order to make sure baby able to transfer enough at breast and to protect mom's milk supply.   Mom aware of OP/BFSG and [place] phone line assistance after D/C. Mom intends to call for OP appointment after D/C. Mom given OP appointment preparation sheet. Enc mom to call for assistance as needed.  Maternal Data    Feeding Feeding Type: Breast Milk Length of feed: 10 min  LATCH Score/Interventions                      Lactation Tools Discussed/Used     Consult Status Consult Status: PRN    Inocente Salles 09/17/2014, 9:23 AM

## 2014-09-29 ENCOUNTER — Other Ambulatory Visit: Payer: Self-pay

## 2014-10-09 ENCOUNTER — Ambulatory Visit (HOSPITAL_COMMUNITY)
Admission: RE | Admit: 2014-10-09 | Discharge: 2014-10-09 | Disposition: A | Discharge status: Home / Self Care | Payer: 59 | Source: Ambulatory Visit | Attending: Obstetrics & Gynecology | Admitting: Obstetrics & Gynecology

## 2014-10-09 NOTE — Lactation Note (Signed)
Lactation Consult  Mother's reason for visit: use of nipple shield and possible trying to wean  Visit Type:  Feeding assessment  Appointment Notes: +wean from using a nipple shield - left apt. reminder Consult:  Initial Lactation Consultant:  Myer Haff  ________________________________________________________________________ 39 Name: Olivia Moon Date of Birth: 09/13/2015 Pediatrician: Dr. Guy Sandifer Aleatha Borer  Gender: female Gestational Age: [redacted]w[redacted]d (At Birth) Birth Weight: 5 lb 15.6 oz (2710 g) Weight at Discharge: Weight: 5 lb 7.9 oz (2493 g)Date of Discharge: 09/17/2014 Filed Weights   09/15/14 1500 09/16/14 1500 09/17/14 1035  Weight: 5 lb 12.4 oz (2619 g) 5 lb 7.9 oz (2491 g) 5 lb 7.9 oz (2493 g)   Last weight taken from location outside of [facility]: 6-1 oz ( 6/27)  Location:Pediatrician's office Weight today: 3144 g , 6-14.9 oz , wet diaper and re-weighed , 3122g , 6-14.1 oz   ______________________________________________________________________  Mother's Name: Jolyne Loa Type of delivery:  Vaginal  Breastfeeding Experience:  Experienced 3 other babies ( 1st didn't latch - pumped and bottle , 2nd baby 8 weeks , 3rd 8 months  Maternal Medical Conditions:  PIH , post Magso4 , excessive edema  Maternal Medications: PNV   ________________________________________________________________________  Breastfeeding History (Post Discharge)  Frequency of breastfeeding: 2 1/2 - 3 hours ,  Duration of feeding:  15-18 mins   Supplementing: none since at home   Pumping: [place] both breast after every other feeding for 15 mins , with 5-6 oz EBM yield and freezing it.  Infant Intake and Output Assessment  Voids:  10-12  in 24 hrs.  Color:  Clear yellow Stools:  8-10  in 24 hrs.  Color:  Yellow  ________________________________________________________________________  Maternal Breast Assessment  Breast:   Full Nipple:  Erect Pain level:  0 Pain interventions:  Expressed breast milk  _______________________________________________________________________ Feeding Assessment/Evaluation - baby still slight jaundice , alert , rooting , hydrated well , and filling out   Initial feeding assessment:  Infant's oral assessment:  WNL  Positioning:  Laid back Right breast  LATCH documentation:  Latch:  2 = Grasps breast easily, tongue down, lips flanged, rhythmical sucking.  Audible swallowing:  2 = Spontaneous and intermittent  Type of nipple:  2 = Everted at rest and after stimulation - base of areola - swollen , semi compressible - hand expressed   Comfort (Breast/Nipple):  1 = Filling, red/small blisters or bruises, mild/mod discomfort  Hold (Positioning):  1 = Assistance needed to correctly position infant at breast and maintain latch  LATCH score:  8  Attached assessment:  Shallow @ 1st , able to obtain depth   Lips flanged:  Yes.    Lips untucked:  Yes.    Suck assessment:  Nutritive  Tools:  Nipple shield 20 mm - ( sized for the larger size #24 , still to large for nipple )  Instructed on use and cleaning of tool:  Yes.    Pre-feed weight:  3122g   (6-14.1 oz.) Post-feed weight: 3178g (7.0.1 oz ) Amount transferred:  56 ml  Amount supplemented:  None Hand expressed prior to latch = 4 ml   Additional Feeding Assessment -   Infant's oral assessment:  WNL   Positioning:  Football Left breast  LATCH documentation:  Latch:  2 = Grasps breast easily, tongue down, lips flanged, rhythmical sucking.  Audible swallowing:  2 = Spontaneous and intermittent  Type of nipple:  2 = Everted at rest and after stimulation  Comfort (Breast/Nipple):  1 = Filling, red/small blisters or bruises, mild/mod discomfort  Hold (Positioning):  1 = Assistance needed to correctly position infant at breast and maintain latch  LATCH score:  8   Attached assessment:  Deep  Lips flanged:  Yes.    Lips  untucked:  Yes.    Suck assessment:  Nutritive  Tools:  Nipple shield 20 mm Instructed on use and cleaning of tool:  Yes.     Stool changed - re-weight ( yellowish brown   Pre-feed weight:  3172 g,  (6-15.9 oz  ) Post-feed weight: 3198 g     (7.0.8 oz  ) Amount transferred: 26 ml  Amount supplemented:  None needed    Total amount pumped post feed: did not post pump   Total amount transferred:  82 ml / plus hand expressed 4 ml prior to latch  Total supplement given:  None needed   Lactation Impression :  This 83 week old infant baby "Olivia Moon " is doing well with latching with a nipple shield .  Baby gaining weight well ( BW - 5-15, today's weight - 6-14 .9 oz ) ( excellent weight gain )  Mom has done a great job latching and establishing and protecting milk supply. Able to feed on both breast and transfer total 82 ml off . Also able to stay in a consistent  Feeding pattern and be nutritive majority of feeding. LC felt due to the baby recessed chin and the semi compressible areola the nipple shield was still indicated for latch # - 30  Football position with feeding seemed to work better to obtain depth the breast.     Lactation Plan of Care :  LC F/U apt for next Thursday 7/14 at 9 am at [facility]  Praised mom for her breast feeding efforts  Mom - drink plenty of fluids , esp. Water , nutritious snacks and calories , meals , naps if possible  Steps for latching -  Stressed the importance if to full to hand express or use hand pump to pre-pump off fullness, ( even 10 ml )  So NS will fit better.  For now use the #20 NS , reassess for #24 next Thursday 7/14 at [facility] apt. Work on Development worker, community to open wider for latch by aiming nipple toward her nose upper lip latch with breast compressions until  swallows, then intermittent , then ease her chin down. Rotate positions between at least 2 - football , cross cradle  Post pump after 5-6 feedings - 10 -15 mins , save milk ( will be reassessed  next week )

## 2014-10-16 ENCOUNTER — Ambulatory Visit (HOSPITAL_COMMUNITY): Admission: RE | Admit: 2014-10-16 | Payer: 59 | Source: Ambulatory Visit

## 2014-10-24 ENCOUNTER — Ambulatory Visit (INDEPENDENT_AMBULATORY_CARE_PROVIDER_SITE_OTHER): Payer: 59 | Admitting: Family

## 2014-10-24 ENCOUNTER — Encounter: Payer: Self-pay | Admitting: Family

## 2014-10-24 NOTE — Progress Notes (Signed)
Patient ID: MARIELY MAHR, female   DOB: 1980-07-26, 34 y.o.   MRN: [id number] Post Partum Exam  DORTHA NEIGHBORS is a 34 y.o. [id number] female who presents for a postpartum visit. She is 6 weeks  postpartum following a vaginal delivery @ 34 weeks and 4 days. I have fully reviewed the prenatal and intrapartum course. The delivery was at [place] 4 d gestational weeks.  Anesthesia: Epidural. Postpartum course has been unremarkable. Baby's course has been unremarkable and was in NICU 4 days. Baby is feeding by breast. Bleeding has stopped. No bleeding x 1 week.  Bowel function is normal. Bladder function is normal. Patient has not been sexually active. Contraception method is vasectomy.  Postpartum depression screening: neg.  Reports increased anxiety with childcare.  Improving over past few weeks.    The following portions of the patient's history were reviewed and updated as appropriate: allergies, current medications, past family history, past medical history, past social history, past surgical history and problem list.  Review of Systems Pertinent items are noted in HPI.   Objective:     General:  alert, cooperative and appears stated age   Breasts:  inspection negative, no nipple discharge or bleeding, no masses or nodularity palpable  Lungs: clear to auscultation bilaterally  Heart:  regular rate and rhythm, S1, S2 normal, no murmur, click, rub or gallop  Abdomen: soft, non-tender; bowel sounds normal; no masses,  no organomegaly  Pelvic exam not indicated - not bleeding, no pain at vaginal area  Assessment:    Normal postpartum exam. Pap smear not done at today's visit.   Plan:    1. Contraception: vasectomy .  Completed 2 weeks prior to delivery.   2.  Follow up in: 2 years for pap smear or as needed.

## 2015-02-24 ENCOUNTER — Encounter: Payer: Self-pay | Admitting: Osteopathic Medicine

## 2015-02-24 ENCOUNTER — Ambulatory Visit (INDEPENDENT_AMBULATORY_CARE_PROVIDER_SITE_OTHER): Payer: 59 | Admitting: Osteopathic Medicine

## 2015-02-24 VITALS — BP 120/61 | HR 81 | Ht 63.0 in | Wt 236.0 lb

## 2015-02-24 DIAGNOSIS — G510 Bell's palsy: Secondary | ICD-10-CM

## 2015-02-24 HISTORY — DX: Bell's palsy: G51.0

## 2015-02-24 NOTE — Progress Notes (Signed)
HPI: Olivia Moon is a 34 y.o. female who presents to [facility]  today for chief complaint of:  Chief Complaint  Patient presents with  . Establish Care    Bell's palsy   Bell's Palsy, this is the third time she has had this, last was age 85, first time age 85. Age 1 went away in few weeks, Age 55 got better more slowly, probably got about 90% better still was some facial drooping. Each recurrences happened on the right side of the face  This episode started 4 days ago mild beginning with some taste changes, then got worse 3 days ago and went to urgent care, was prescribed prednisone taper. On Day 3 prednisone now. Stomach virus last week prior to this episode of Bell's palsy.   She has some difficulty closing the right eyelid fully, she has been using eyedrops every hour or so and wearing an eye patch at night when she is asleep  She states the physician at urgent care had discussed with her viral testing but advised that this was unlikely to be of any benefit, most likely culprit behind this case of Bell's palsy was viral gastroenteritis rather than herpes virus or other. Patient denies ever having genital or oral herpes outbreak, states she has never had chickenpox.   Past medical, social and family history reviewed: Past Medical History  Diagnosis Date  . Gestational diabetes mellitus, antepartum   . Preeclampsia     "with first pregnancy"  . Gestational diabetes   . Pregnancy induced hypertension    Past Surgical History  Procedure Laterality Date  . None    . No past surgeries    . Cesarean section N/A 08/21/2013    Procedure: CESAREAN SECTION;  Surgeon: Woodroe Mode, MD;  Location: [facility];  Service: Obstetrics;  Laterality: N/A;   Social History  Substance Use Topics  . Smoking status: Never Smoker   . Smokeless tobacco: Never Used  . Alcohol Use: No     Comment: before pregnancy   Family History  Problem Relation Age of Onset   . Diabetes Mother   . Breast cancer Maternal Grandmother   . Hypertension Mother   . Breast cancer Maternal Aunt   . Hypertension Maternal Grandmother   . Hypertension Sister   . Diabetes Sister   . Heart disease Father   . Rheumatic fever Father   . Heart disease Paternal Grandfather     Current Outpatient Prescriptions  Medication Sig Dispense Refill  . predniSONE (DELTASONE) 10 MG tablet Take 10 mg by mouth daily with breakfast.     No current facility-administered medications for this visit.   Allergies  Allergen Reactions  . Aspirin Anaphylaxis  . Decadron [Dexamethasone] Shortness Of Breath and Itching      Review of Systems: CONSTITUTIONAL:  No  fever, no chills, No  unintentional weight changes HEAD/EYES/EARS/NOSE/THROAT: No headache, no vision change, no hearing change, No  sore throat CARDIAC: No chest pain, no pressure/palpitations, no orthopnea RESPIRATORY: No  cough, No  shortness of breath/wheeze GASTROINTESTINAL: No nausea, no vomiting, no abdominal pain, no blood in stool, no diarrhea, no constipation MUSCULOSKELETAL: No  myalgia/arthralgia GENITOURINARY: No incontinence, No abnormal genital bleeding/discharge SKIN: No rash/wounds/concerning lesions HEM/ONC: No easy bruising/bleeding, no abnormal lymph node ENDOCRINE: No polyuria/polydipsia/polyphagia, no heat/cold intolerance  NEUROLOGIC: No weakness other than what is associated with right-sided the face Bell's palsy as noted in history of present illness, no dizziness, no slurred speech  PSYCHIATRIC: No concerns with depression, no concerns with anxiety, no sleep problems    Exam:  BP 120/61 mmHg  Pulse 81  Ht 5\' 3"  (1.6 m)  Wt 236 lb (107.049 kg)  BMI 41.82 kg/m2 Constitutional: VSS, see above. General Appearance: alert, well-developed, well-nourished, NAD Eyes: Normal lids and conjunctive, non-icteric sclera, PERRLA Ears, Nose, Mouth, Throat: Normal external inspection ears/nares/mouth/lips/gums,  TM normal bilaterally, MMM, posterior pharynx No  erythema No  exudate Neck: No masses, trachea midline. No thyroid enlargement/tenderness/mass appreciated. No lymphadenopathy Respiratory: Normal respiratory effort. no wheeze, no rhonchi, no rales Cardiovascular: S1/S2 normal, no murmur, no rub/gallop auscultated. RRR. No lower extremity edema.  Musculoskeletal: Gait normal. No clubbing/cyanosis of digits. See neurologic exam below  Neurological: Significant deficits of right cranial nerve VII, weakness in right for head and right muscles of facial expression, unable to close right eyelid, unable to smile and right-sided face, sensation intact bilaterally, other cranial nerves intact. Extremity strength 5-5 all 4 extremities, normal gait, pupils equal round reactive to light and accommodation Psychiatric: Normal judgment/insight. Normal mood and affect. Oriented x3.    No results found for this or any previous visit (from the past 72 hour(s)).    ASSESSMENT/PLAN: Recurrent Bell's palsy, patient is already on steroids, doubt antiviral medication or viral testing would add much at this point. Patient counseled extensively on natural history of this illness. Recurrences do not necessarily mean that resolution will be any longer or shorter, she is advised to let me know if her symptoms get worse any time over the next 3 weeks or so, let me know if her symptoms have not resolved after 3-4 months, at this point would consider neuroimaging. Patient is already on ocular precautions, she is advised to continue this to prevent corneal damage, if eye pain becomes an issue will refer to ophthalmology.     Return in about 6 months (around 08/24/2015), or sooner if symptoms worsen or fail to improve, for [employer]. .  Total time spent 45 minutes, greater than 50% of the visit was counseling and coordinating care for diagnosis of Bell's Palsy.

## 2015-02-24 NOTE — Patient Instructions (Addendum)
If worse in 3 weeks, or if no better in 4 weeks, come back to clinic and we will need to arrange imaging of the brain. If eye problems are getting worse, we may consider referral to ophthalmology for further management. Otherwise continue eye drops and patch at night.    Bell Palsy Bell palsy is a condition in which the muscles on one side of the face become paralyzed. This often causes one side of the face to droop. It is a common condition and most people recover completely. RISK FACTORS Risk factors for Bell palsy include:  Pregnancy.  Diabetes.  An infection by a virus, such as infections that cause cold sores. CAUSES  Bell palsy is caused by damage to or inflammation of a nerve in your face. It is unclear why this happens, but an infection by a virus may lead to it. Most of the time the reason it happens is unknown. SIGNS AND SYMPTOMS  Symptoms can range from mild to severe and can take place over a number of hours. Symptoms may include:  Being unable to:  Raise one or both eyebrows.  Close one or both eyes.  Feel parts of your face (facial numbness).  Drooping of the eyelid and corner of the mouth.  Weakness in the face.  Paralysis of half your face.  Loss of taste.  Sensitivity to loud noises.  Difficulty chewing.  Tearing up of the affected eye.  Dryness in the affected eye.  Drooling.  Pain behind one ear. DIAGNOSIS  Diagnosis of Bell palsy may include:  A medical history and physical exam.  An MRI.  A CT scan.  Electromyography (EMG). This is a test that checks how your nerves are working. TREATMENT  Treatment may include antiviral medicine to help shorten the length of the condition. Sometimes treatment is not needed and the symptoms go away on their own. HOME CARE INSTRUCTIONS   Take medicines only as directed by your health care provider.  Do facial massages and exercises as directed by your health care provider.  If your eye is  affected:  Use moisturizing eye drops to prevent drying of your eye as directed by your health care provider.  Protect your eye as directed by your health care provider. SEEK MEDICAL CARE IF:  Your symptoms do not get better or get worse.  You are drooling.  Your eye is red, irritated, or hurts. SEEK IMMEDIATE MEDICAL CARE IF:   Another part of your body feels weak or numb.  You have difficulty swallowing.  You have a fever along with symptoms of Bell palsy.  You develop neck pain. MAKE SURE YOU:   Understand these instructions.  Will watch your condition.  Will get help right away if you are not doing well or get worse.   This information is not intended to replace advice given to you by your health care provider. Make sure you discuss any questions you have with your health care provider.   Document Released: 03/21/2005 Document Revised: 12/10/2014 Document Reviewed: 06/28/2013 Elsevier Interactive Patient Education [employer].

## 2015-03-17 ENCOUNTER — Telehealth: Payer: Self-pay | Admitting: Osteopathic Medicine

## 2015-03-17 DIAGNOSIS — G510 Bell's palsy: Secondary | ICD-10-CM

## 2015-03-17 NOTE — Telephone Encounter (Signed)
Dr. Sheppard Coil,  Pt called. She said Bell's Palsey condition is not getting any better and she  wants to be referred to a Neurologist.  Thank you.

## 2015-03-17 NOTE — Telephone Encounter (Signed)
Spoke with pt about the referral to neuro. She should hear something from their office soon.

## 2015-03-17 NOTE — Telephone Encounter (Signed)
Dr. Sheppard Coil please see note below. Jacson Rapaport,CMA

## 2015-03-17 NOTE — Telephone Encounter (Signed)
Referral placed, please call patient let her know she should hear about an appointment sometime this week, let us know if she hasn't heard anything by Friday.

## 2015-03-19 ENCOUNTER — Ambulatory Visit (INDEPENDENT_AMBULATORY_CARE_PROVIDER_SITE_OTHER): Payer: 59 | Admitting: Neurology

## 2015-03-19 ENCOUNTER — Encounter: Payer: Self-pay | Admitting: Neurology

## 2015-03-19 VITALS — BP 132/87 | HR 83 | Ht 63.0 in | Wt 241.2 lb

## 2015-03-19 DIAGNOSIS — E669 Obesity, unspecified: Secondary | ICD-10-CM

## 2015-03-19 DIAGNOSIS — G510 Bell's palsy: Secondary | ICD-10-CM

## 2015-03-19 MED ORDER — VALACYCLOVIR HCL 1 G PO TABS: 1000.0000 mg | ORAL_TABLET | Freq: Three times a day (TID) | ORAL | Status: DC

## 2015-03-19 MED ORDER — ARTIFICIAL TEARS OP OINT: TOPICAL_OINTMENT | OPHTHALMIC | Status: DC | PRN

## 2015-03-19 MED ORDER — PREDNISONE 20 MG PO TABS: 60.0000 mg | ORAL_TABLET | Freq: Every day | ORAL | Status: DC

## 2015-03-19 MED ORDER — HYPROMELLOSE 0.4 % OP SOLN: 2.0000 [drp] | OPHTHALMIC | Status: DC

## 2015-03-19 NOTE — Progress Notes (Signed)
NEUROLOGY CLINIC NEW PATIENT NOTE  NAME: Olivia Moon DOB: 02-03-1981 REFERRING PHYSICIAN: Emeterio Reeve, DO  I saw Olivia Moon as a new consult in the neurovascular clinic today regarding  Chief Complaint  Patient presents with  . Referral    from Dr. Sheppard Coil  .  HPI: Olivia Moon is a 34 y.o. female with PMH of gestational DM and obesity who presents as a new patient for recurrent bells palsy.   She had her first Bell's palsy at age of 15, resolved completely and relatively fast. She had her second Bell's palsy at age of 64, near complete occlusion within 3 weeks. Patient stated that about 4 weeks ago she started to have her third occurrence of Bell's palsy with constant pain at the back of her right ear, dry eyes at right and taste changes, not able to close right eye with right facial droop. Prior to this recurrence, she had viral infection in her stomach. She was given 10 days of prednisone (60mg  x4, 40mg  x4 and 20mg  x2). So far the facial droop has not improved. Each recurrences happened on the right side of the face. Patient denies ever having genital or oral herpes outbreak, states she has never had chickenpox. Denies any facial or tongue swelling.  She denies any significant PMH, or FHx of bells palsy. Denies cigarette smoking, alcohol drinking or illicit drugs. She works as a Oncologist.  Past Medical History  Diagnosis Date  . Gestational diabetes mellitus, antepartum   . Preeclampsia     "with first pregnancy"  . Gestational diabetes   . Pregnancy induced hypertension   . Bell's palsy 02/24/2015    Recurrent 3, age 19 lasted few weeks, age 34 lasted few months, age 55 onset after stomach viral illness, initial encounter 02/24/2015.   . Bell's palsy    Past Surgical History  Procedure Laterality Date  . None    . No past surgeries    . Cesarean section N/A 08/21/2013    Procedure: CESAREAN SECTION;  Surgeon: Woodroe Mode, MD;  Location:  [facility];  Service: Obstetrics;  Laterality: N/A;   Family History  Problem Relation Age of Onset  . Diabetes Mother   . Hypertension Mother   . Breast cancer Maternal Grandmother   . Hypertension Maternal Grandmother   . Breast cancer Maternal Aunt   . Hypertension Sister   . Diabetes Sister   . Heart disease Father   . Rheumatic fever Father   . Heart disease Paternal Grandfather    Current Outpatient Prescriptions  Medication Sig Dispense Refill  . artificial tears (LACRILUBE) OINT ophthalmic ointment Place into both eyes every 4 (four) hours as needed for dry eyes. 1 Tube 3  . Hypromellose (ARTIFICIAL TEARS) 0.4 % SOLN Apply 2 drops to eye every 4 (four) hours. 2 Bottle 3  . predniSONE (DELTASONE) 20 MG tablet Take 3 tablets (60 mg total) by mouth daily. 21 tablet 0  . valACYclovir (VALTREX) 1000 MG tablet Take 1 tablet (1,000 mg total) by mouth 3 (three) times daily. 21 tablet 0   No current facility-administered medications for this visit.   Allergies  Allergen Reactions  . Aspirin Anaphylaxis  . Decadron [Dexamethasone] Shortness Of Breath and Itching   Social History   Social History  . Marital Status: Married    Spouse Name: N/A  . Number of Children: N/A  . Years of Education: N/A   Occupational History  . Not on file.  Social History Main Topics  . Smoking status: Never Smoker   . Smokeless tobacco: Never Used  . Alcohol Use: 0.6 oz/week    1 Glasses of wine per week     Comment: occssioanlly  . Drug Use: No  . Sexual Activity: Yes    Birth Control/ Protection: None   Other Topics Concern  . Not on file   Social History Narrative    Review of Systems Full 14 system review of systems performed and notable only for those listed, all others are neg:  Constitutional:   Cardiovascular:  Ear/Nose/Throat:   Skin:  Eyes:  Blurred vision, eye pain Respiratory:   Gastroitestinal:   Genitourinary:  Hematology/Lymphatic:   Endocrine:  Musculoskeletal:    Allergy/Immunology:   Neurological:  Headache, slurry speech Psychiatric:  Sleep:    Physical Exam  Filed Vitals:   03/19/15 0945  BP: 132/87  Pulse: 83    General - Well nourished, well developed, in no apparent distress.  Ophthalmologic - Sharp disc margins OU.  Cardiovascular - Regular rate and rhythm with no murmur.  Neck - supple, no nuchal rigidity .  Mental Status -  Level of arousal and orientation to time, place, and person were intact. Language including expression, naming, repetition, comprehension, reading, and writing was assessed and found intact. Attention span and concentration were normal. Recent and remote memory were intact. Fund of Knowledge was assessed and was intact.  Cranial Nerves II - XII - II - Visual field intact OU. III, IV, VI - Extraocular movements intact, PERRL. V - Facial sensation intact bilaterally. VII - right peripheral facial palsy. VIII - Hearing & vestibular intact bilaterally, no nystagmus, or hyperacusis. X - Palate elevates symmetrically. XI - Chin turning & shoulder shrug intact bilaterally. XII - Tongue protrusion intact.  Motor Strength - The patient's strength was normal in all extremities and pronator drift was absent.  Bulk was normal and fasciculations were absent.   Motor Tone - Muscle tone was assessed at the neck and appendages and was normal.  Reflexes - The patient's reflexes were normal in all extremities and she had no pathological reflexes.  Sensory - Light touch, temperature/pinprick, vibration and proprioception, and Romberg testing were assessed and were normal.    Coordination - The patient had normal movements in the hands and feet with no ataxia or dysmetria.  Tremor was absent.  Gait and Station - The patient's transfers, posture, gait, station, and turns were observed as normal.   Imaging  None  Lab Review None   Assessment and Plan:   In summary, Olivia Moon is a 34 y.o. female with  PMH of gestational DM and obesity presents with recurrent right Bell's palsy for the third time. No improvement for the last 4 weeks. Finished 10 days steroids treatment. Etiology not clear, denial of facial swelling makes melkersson-rosenthal syndrome less likely. 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 According to the literature, 3% patient may have recurrent Bell's palsy 3, and 1.5% may have recurrent Bell's point 4. However, we need to rule out CNS tumor, Lyme disease, sarcoidosis, Sjogren's, HIV, and thyroid disease. EMG/NCS for prognostication.  - MRI brain with and without contrast to rule out structural lesions. - EMG/NCS for prognostication - blood tests to rule out sarcoidosis, Lyme disease, Sjogren's, HIV and thyroid disease - continue PT - prednisone 60mg  daily for 7 days and off and valacyclovir 1g three times a day for 7 days. - artifical tears  and ointment for corneal protection. - follow up in one month.  Thank you very much for the opportunity to participate in the care of this patient.  Please do not hesitate to call if any questions or concerns arise.  Orders Placed This Encounter  Procedures  . MR Brain W Wo Contrast    Standing Status: Future     Number of Occurrences:      Standing Expiration Date: 05/20/2016    Order Specific Question:  Reason for Exam (SYMPTOM  OR DIAGNOSIS REQUIRED)    Answer:  recurrent bells palsy    Order Specific Question:  Preferred imaging location?    Answer:  Internal    Order Specific Question:  Does the patient have a pacemaker or implanted devices?    Answer:  No    Order Specific Question:  What is the patient's sedation requirement?    Answer:  No Sedation  . TSH + free T4  . RPR  . ANA  . Hemoglobin A1c  . HIV antibody  . C-reactive protein  . B. burgdorfi Antibody  . Angiotensin converting enzyme  . Sjogren's syndrome antibods(ssa + ssb)  . Basic metabolic panel  . CBC (no  diff)  . Sedimentation rate  . NCV with EMG(electromyography)    Standing Status: Future     Number of Occurrences:      Standing Expiration Date: 03/18/2016    Meds ordered this encounter  Medications  . predniSONE (DELTASONE) 20 MG tablet    Sig: Take 3 tablets (60 mg total) by mouth daily.    Dispense:  21 tablet    Refill:  0  . valACYclovir (VALTREX) 1000 MG tablet    Sig: Take 1 tablet (1,000 mg total) by mouth 3 (three) times daily.    Dispense:  21 tablet    Refill:  0  . artificial tears (LACRILUBE) OINT ophthalmic ointment    Sig: Place into both eyes every 4 (four) hours as needed for dry eyes.    Dispense:  1 Tube    Refill:  3  . Hypromellose (ARTIFICIAL TEARS) 0.4 % SOLN    Sig: Apply 2 drops to eye every 4 (four) hours.    Dispense:  2 Bottle    Refill:  3    Patient Instructions  - will do MRI brain with and without contrast. - will nerve conduction study for prognostication - will do blood tests to rule out other conditions - continue PT - prednisone 60mg  daily for 7 days and off and valacyclovir 1g three times a day for 7 days. - use artifical tears for corneal protection. - avoid viral infection as you can. - follow up in one month.    Rosalin Hawking, MD PhD [facility] [address] [place], [place] [address] [phone]

## 2015-03-19 NOTE — Patient Instructions (Addendum)
-   will do MRI brain with and without contrast. - will nerve conduction study for prognostication - will do blood tests to rule out other conditions - continue PT - prednisone 60mg  daily for 7 days and off and valacyclovir 1g three times a day for 7 days. - use artifical tears for corneal protection. - avoid viral infection as you can. - follow up in one month.

## 2015-03-20 LAB — CBC
HEMATOCRIT: 33.7 % — AB (ref 34.0–46.6)
Hemoglobin: 11.6 g/dL (ref 11.1–15.9)
MCH: 30.4 pg (ref 26.6–33.0)
MCHC: 34.4 g/dL (ref 31.5–35.7)
MCV: 88 fL (ref 79–97)
PLATELETS: 227 10*3/uL (ref 150–379)
RBC: 3.82 x10E6/uL (ref 3.77–5.28)
RDW: 14.8 % (ref 12.3–15.4)
WBC: 5.8 10*3/uL (ref 3.4–10.8)

## 2015-03-20 LAB — HEMOGLOBIN A1C
ESTIMATED AVERAGE GLUCOSE: 128 mg/dL
Hgb A1c MFr Bld: 6.1 % — ABNORMAL HIGH (ref 4.8–5.6)

## 2015-03-20 LAB — BASIC METABOLIC PANEL
BUN / CREAT RATIO: 15 (ref 8–20)
BUN: 10 mg/dL (ref 6–20)
CO2: 21 mmol/L (ref 18–29)
CREATININE: 0.67 mg/dL (ref 0.57–1.00)
Calcium: 9.1 mg/dL (ref 8.7–10.2)
Chloride: 102 mmol/L (ref 96–106)
GFR, EST AFRICAN AMERICAN: 132 mL/min/{1.73_m2} (ref >59)
GFR, EST NON AFRICAN AMERICAN: 114 mL/min/{1.73_m2} (ref >59)
Glucose: 131 mg/dL — ABNORMAL HIGH (ref 65–99)
Potassium: 4.6 mmol/L (ref 3.5–5.2)
SODIUM: 140 mmol/L (ref 134–144)

## 2015-03-20 LAB — C-REACTIVE PROTEIN: CRP: 9.5 mg/L — AB (ref 0.0–4.9)

## 2015-03-20 LAB — SEDIMENTATION RATE: Sed Rate: 25 mm/hr (ref 0–32)

## 2015-03-20 LAB — HIV ANTIBODY (ROUTINE TESTING W REFLEX): HIV SCREEN 4TH GENERATION: NONREACTIVE

## 2015-03-20 LAB — TSH+FREE T4
Free T4: 0.79 ng/dL — ABNORMAL LOW (ref 0.82–1.77)
TSH: 2.88 u[IU]/mL (ref 0.450–4.500)

## 2015-03-20 LAB — ANGIOTENSIN CONVERTING ENZYME: Angio Convert Enzyme: 28 U/L (ref 14–82)

## 2015-03-20 LAB — ANA: ANA: NEGATIVE

## 2015-03-20 LAB — SJOGREN'S SYNDROME ANTIBODS(SSA + SSB)
ENA SSA (RO) Ab: <0.2 AI (ref 0.0–0.9)
ENA SSB (LA) Ab: <0.2 AI (ref 0.0–0.9)

## 2015-03-20 LAB — RPR: RPR: NONREACTIVE

## 2015-03-20 LAB — B. BURGDORFI ANTIBODIES: Lyme IgG/IgM Ab: <0.91 {ISR} (ref 0.00–0.90)

## 2015-03-23 ENCOUNTER — Telehealth: Payer: Self-pay | Admitting: Neurology

## 2015-03-23 NOTE — Telephone Encounter (Signed)
LFt vm for patient to call about her elevated c reactive lab.

## 2015-03-23 NOTE — Telephone Encounter (Signed)
Rn cal patient about her lab work she saw in my chart. Pt stated her c reactive was elevated from the lab work. Rn stated Dr. Erlinda Hong review her lab work and stated all her labs were unremarkable at this time.PT stated the numbers in her mind could be Bells Palsy what she was seen for. Rn stated Dr. Erlinda Hong is out of the office this week and will not return till next week. Rn stated a working md can call her or Dr. Erlinda Hong. Pt was find with a call back. Rn stated Dr Erlinda Hong is out of the office this week. Rn will consult with working md about her c reactive protein being elevated.

## 2015-03-23 NOTE — Telephone Encounter (Signed)
Rn receive incoming call from patient about hr elevated c reactive lab. Rn stated the working md who was Dr. Brett Fairy stated it was a unspecific inflammatory maker and not worry. Pt was happy and relieve that another MD review the labs.She was finally satisfied with the second opinion and clarification.

## 2015-03-23 NOTE — Telephone Encounter (Signed)
Unspecific inflammatory marker- not to worry. CD

## 2015-03-23 NOTE — Telephone Encounter (Signed)
Patient called regarding lab results she received through MyChart, C reactive protein high, would like to speak with nurse about this.

## 2015-03-24 ENCOUNTER — Ambulatory Visit (INDEPENDENT_AMBULATORY_CARE_PROVIDER_SITE_OTHER): Payer: 59 | Admitting: Osteopathic Medicine

## 2015-03-24 ENCOUNTER — Encounter: Payer: Self-pay | Admitting: Osteopathic Medicine

## 2015-03-24 VITALS — BP 111/64 | HR 78 | Ht 63.0 in | Wt 232.0 lb

## 2015-03-24 DIAGNOSIS — Z23 Encounter for immunization: Secondary | ICD-10-CM

## 2015-03-24 DIAGNOSIS — G510 Bell's palsy: Secondary | ICD-10-CM

## 2015-03-24 DIAGNOSIS — R7303 Prediabetes: Secondary | ICD-10-CM | POA: Insufficient documentation

## 2015-03-24 DIAGNOSIS — Z13220 Encounter for screening for lipoid disorders: Secondary | ICD-10-CM | POA: Diagnosis not present

## 2015-03-24 NOTE — Progress Notes (Signed)
HPI: Olivia Moon is a 34 y.o. female who presents to [facility]  today for chief complaint of:  Chief Complaint  Patient presents with  . Follow-up    LABS   Bell's Palsy, this is the third time she has had this, last was age 69, first time age 52. Age 59 went away in few weeks, Age 28 got better more slowly, probably got about 90% better still was some facial drooping. Each recurrences happened on the right side of the face. This episode was treated with steroids and antivirals, but persisted >4 weeks so we referred her to Neurology. They did additional blood tests so she had some questions about these results. Palsy not much improved. MRI pending  Labs discussed: (+) mild elevated CRP, mild low T4 but normal TSH, neg HIV, RPR, Lyme, ANA, ESR, SSA-Ro, SSA-La, mild low Hct, mild high Glc on BMP. All questions answered.   Prediabetes: A1C drawn with recent labs, pt has hx GDM x2 but not with most recent pregnancy.   Lipids: no lipids on records    Past medical, social and family history reviewed: Past Medical History  Diagnosis Date  . Gestational diabetes mellitus, antepartum   . Preeclampsia     "with first pregnancy"  . Gestational diabetes   . Pregnancy induced hypertension   . Bell's palsy 02/24/2015    Recurrent 3, age 64 lasted few weeks, age 51 lasted few months, age 17 onset after stomach viral illness, initial encounter 02/24/2015.   . Bell's palsy    Past Surgical History  Procedure Laterality Date  . None    . No past surgeries    . Cesarean section N/A 08/21/2013    Procedure: CESAREAN SECTION;  Surgeon: Woodroe Mode, MD;  Location: [facility];  Service: Obstetrics;  Laterality: N/A;   Social History  Substance Use Topics  . Smoking status: Never Smoker   . Smokeless tobacco: Never Used  . Alcohol Use: 0.6 oz/week    1 Glasses of wine per week     Comment: occssioanlly   Family History  Problem Relation Age of Onset  .  Diabetes Mother   . Hypertension Mother   . Breast cancer Maternal Grandmother   . Hypertension Maternal Grandmother   . Breast cancer Maternal Aunt   . Hypertension Sister   . Diabetes Sister   . Heart disease Father   . Rheumatic fever Father   . Heart disease Paternal Grandfather     Current Outpatient Prescriptions  Medication Sig Dispense Refill  . artificial tears (LACRILUBE) OINT ophthalmic ointment Place into both eyes every 4 (four) hours as needed for dry eyes. 1 Tube 3  . Hypromellose (ARTIFICIAL TEARS) 0.4 % SOLN Apply 2 drops to eye every 4 (four) hours. 2 Bottle 3  . predniSONE (DELTASONE) 20 MG tablet Take 3 tablets (60 mg total) by mouth daily. 21 tablet 0  . valACYclovir (VALTREX) 1000 MG tablet Take 1 tablet (1,000 mg total) by mouth 3 (three) times daily. 21 tablet 0   No current facility-administered medications for this visit.   Allergies  Allergen Reactions  . Aspirin Anaphylaxis  . Decadron [Dexamethasone] Shortness Of Breath and Itching      Review of Systems: CONSTITUTIONAL:  No  fever, no chills, No  unintentional weight changes HEAD/EYES/EARS/NOSE/THROAT: No headache, no vision change, no hearing change, No  sore throat CARDIAC: No chest pain, no pressure/palpitations, no orthopnea RESPIRATORY: No  cough, No  shortness  of breath/wheeze GASTROINTESTINAL: No nausea, no vomiting, no abdominal pain, no blood in stool, no diarrhea, no constipation MUSCULOSKELETAL: No  myalgia/arthralgia GENITOURINARY: No incontinence, No abnormal genital bleeding/discharge SKIN: No rash/wounds/concerning lesions HEM/ONC: No easy bruising/bleeding, no abnormal lymph node ENDOCRINE: No polyuria/polydipsia/polyphagia, no heat/cold intolerance  NEUROLOGIC: No weakness other than what is associated with right-sided the face Bell's palsy as noted in history of present illness, no dizziness, no slurred speech PSYCHIATRIC: No concerns with depression, no concerns with anxiety, no  sleep problems    Exam:  BP 111/64 mmHg  Pulse 78  Ht 5' 3"  (1.6 m)  Wt 232 lb (105.235 kg)  BMI 41.11 kg/m2 Constitutional: VSS, see above. General Appearance: alert, well-developed, well-nourished, NAD Neurological: Significant deficits of right cranial nerve VII, weakness in right for head and right muscles of facial expression, unable to close right eyelid, unable to smile and right-sided face, normal gait,  Psychiatric: Normal judgment/insight. Normal mood and affect. Oriented x3.    No results found for this or any previous visit (from the past 72 hour(s)).    ASSESSMENT/PLAN: Recurrent Bell's palsy, follow with neurology. Will get screening lipids and follow A1C in 3 months, pt opts for lifestyle changes rather than meds at this point.   Bell's palsy - Folowing with Neurology, see notes in Chart Review, MRI pending, labs neg except mild high CRP  Prediabetes - History of GDM x 2 pregnancies  Lipid screening - Plan: Lipid panel  Need for prophylactic vaccination and inoculation against influenza - Pt advised intranasal vaccine, which has been recalled, was assoc w Bells Palsy but injection vaccine should not  - Plan: Flu Vaccine QUAD 36+ mos IM   Return in about 3 months (around 06/22/2015) for A1C RECHECK, WEIGHT CHECK.  Total time spent 25 minutes, greater than 50% of the visit was counseling and coordinating care for diagnosis of labs as above, prediabetes.

## 2015-03-24 NOTE — Patient Instructions (Signed)
Diabetes Mellitus and Food It is important for you to manage your blood sugar (glucose) level. Your blood glucose level can be greatly affected by what you eat. Eating healthier foods in the appropriate amounts throughout the day at about the same time each day will help you control your blood glucose level. It can also help slow or prevent worsening of your diabetes mellitus. Healthy eating may even help you improve the level of your blood pressure and reach or maintain a healthy weight.  General recommendations for healthful eating and cooking habits include:  Eating meals and snacks regularly. Avoid going long periods of time without eating to lose weight.  Eating a diet that consists mainly of plant-based foods, such as fruits, vegetables, nuts, legumes, and whole grains.  Using low-heat cooking methods, such as baking, instead of high-heat cooking methods, such as deep frying. Work with your dietitian to make sure you understand how to use the Nutrition Facts information on food labels. HOW CAN FOOD AFFECT ME? Carbohydrates Carbohydrates affect your blood glucose level more than any other type of food. Your dietitian will help you determine how many carbohydrates to eat at each meal and teach you how to count carbohydrates. Counting carbohydrates is important to keep your blood glucose at a healthy level, especially if you are using insulin or taking certain medicines for diabetes mellitus. Alcohol Alcohol can cause sudden decreases in blood glucose (hypoglycemia), especially if you use insulin or take certain medicines for diabetes mellitus. Hypoglycemia can be a life-threatening condition. Symptoms of hypoglycemia (sleepiness, dizziness, and disorientation) are similar to symptoms of having too much alcohol.  If your health care provider has given you approval to drink alcohol, do so in moderation and use the following guidelines:  Women should not have more than one drink per day, and men  should not have more than two drinks per day. One drink is equal to:  12 oz of beer.  5 oz of wine.  1 oz of hard liquor.  Do not drink on an empty stomach.  Keep yourself hydrated. Have water, diet soda, or unsweetened iced tea.  Regular soda, juice, and other mixers might contain a lot of carbohydrates and should be counted. WHAT FOODS ARE NOT RECOMMENDED? As you make food choices, it is important to remember that all foods are not the same. Some foods have fewer nutrients per serving than other foods, even though they might have the same number of calories or carbohydrates. It is difficult to get your body what it needs when you eat foods with fewer nutrients. Examples of foods that you should avoid that are high in calories and carbohydrates but low in nutrients include:  Trans fats (most processed foods list trans fats on the Nutrition Facts label).  Regular soda.  Juice.  Candy.  Sweets, such as cake, pie, doughnuts, and cookies.  Fried foods. WHAT FOODS CAN I EAT? Eat nutrient-rich foods, which will nourish your body and keep you healthy. The food you should eat also will depend on several factors, including:  The calories you need.  The medicines you take.  Your weight.  Your blood glucose level.  Your blood pressure level.  Your cholesterol level. You should eat a variety of foods, including:  Protein.  Lean cuts of meat.  Proteins low in saturated fats, such as fish, egg whites, and beans. Avoid processed meats.  Fruits and vegetables.  Fruits and vegetables that may help control blood glucose levels, such as apples, mangoes, and   yams.  Dairy products.  Choose fat-free or low-fat dairy products, such as milk, yogurt, and cheese.  Grains, bread, pasta, and rice.  Choose whole grain products, such as multigrain bread, whole oats, and brown rice. These foods may help control blood pressure.  Fats.  Foods containing healthful fats, such as nuts,  avocado, olive oil, canola oil, and fish. DOES EVERYONE WITH DIABETES MELLITUS HAVE THE SAME MEAL PLAN? Because every person with diabetes mellitus is different, there is not one meal plan that works for everyone. It is very important that you meet with a dietitian who will help you create a meal plan that is just right for you.   This information is not intended to replace advice given to you by your health care provider. Make sure you discuss any questions you have with your health care provider.   Document Released: 12/16/2004 Document Revised: 04/11/2014 Document Reviewed: 02/15/2013 Elsevier Interactive Patient Education 2016 Elsevier Inc.  

## 2015-04-01 ENCOUNTER — Ambulatory Visit (INDEPENDENT_AMBULATORY_CARE_PROVIDER_SITE_OTHER): Payer: 59 | Admitting: Diagnostic Neuroimaging

## 2015-04-01 ENCOUNTER — Encounter (INDEPENDENT_AMBULATORY_CARE_PROVIDER_SITE_OTHER): Payer: Self-pay | Admitting: Diagnostic Neuroimaging

## 2015-04-01 DIAGNOSIS — G510 Bell's palsy: Secondary | ICD-10-CM | POA: Diagnosis not present

## 2015-04-01 DIAGNOSIS — Z0289 Encounter for other administrative examinations: Secondary | ICD-10-CM

## 2015-04-01 NOTE — Procedures (Signed)
   [facility]  NCS (NERVE CONDUCTION STUDY) WITH EMG (ELECTROMYOGRAPHY) REPORT   STUDY DATE: 04/01/15 PATIENT NAME: Olivia Moon DOB: 1980/06/19 MRN: [id number]  ORDERING CLINICIAN: Rosalin Hawking, MD PhD   TECHNOLOGIST: Laretta Alstrom  ELECTROMYOGRAPHER: Earlean Polka. Penumalli, MD  CLINICAL INFORMATION: 34 year old female with recurrent right sided Bell's palsy.   FINDINGS: NERVE CONDUCTION STUDY: Bilateral facial nerve motor responses have normal amplitude and latencies. No significant difference between right and left side stimulations.   NEEDLE ELECTROMYOGRAPHY: Needle examination of bilateral orbicularis oculi muscles demonstrates no abnormal spontaneous activity at rest and decreased motor unit recruitment on exertion on the right side with rapid firing units. The left side is normal.   IMPRESSION:  Abnormal study demonstrating: 1. 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 2. No active denervation changes noted. 3. No significant abnormalities or right-left differences on facial nerve motor responses.   INTERPRETING PHYSICIAN:  Penni Bombard, MD Certified in Neurology, Neurophysiology and Neuroimaging  [facility] [address] [place], [place] [address] [phone]

## 2015-04-02 ENCOUNTER — Other Ambulatory Visit: Payer: 59

## 2015-04-10 DIAGNOSIS — G510 Bell's palsy: Secondary | ICD-10-CM | POA: Diagnosis not present

## 2015-04-11 ENCOUNTER — Ambulatory Visit (INDEPENDENT_AMBULATORY_CARE_PROVIDER_SITE_OTHER): Payer: Self-pay

## 2015-04-11 DIAGNOSIS — G510 Bell's palsy: Secondary | ICD-10-CM

## 2015-04-11 DIAGNOSIS — Z0289 Encounter for other administrative examinations: Secondary | ICD-10-CM

## 2015-04-15 ENCOUNTER — Telehealth: Payer: Self-pay | Admitting: Neurology

## 2015-04-15 NOTE — Telephone Encounter (Signed)
Discussed with pt about her MRI and EMG results over the phone. MRI showed mild right facial nerve enhancement which is too subtle to call to my opinion, but no tumor or structural lesions. EMG showed chronic right facial nerve denervation changes but amplitude at right side not low at all. Not able to definitely tell the prognosis. She stated that her right facial palsy has been 2 months now and there was minimal changes. Still use eye drops and oiniments for eye protection. I told pt that we need to give more time to see the long term prognosis. Currently no other intervention indicated or able to provide. Pt expressed understanding and appreciation.  Rosalin Hawking, MD PhD Stroke Neurology 04/15/2015 1:03 PM

## 2015-04-22 ENCOUNTER — Encounter: Payer: 59 | Admitting: Diagnostic Neuroimaging

## 2015-06-10 ENCOUNTER — Ambulatory Visit: Payer: 59 | Admitting: Neurology

## 2015-06-11 ENCOUNTER — Encounter: Payer: Self-pay | Admitting: Neurology

## 2015-06-22 ENCOUNTER — Ambulatory Visit: Payer: 59 | Admitting: Osteopathic Medicine

## 2015-09-28 ENCOUNTER — Ambulatory Visit (INDEPENDENT_AMBULATORY_CARE_PROVIDER_SITE_OTHER): Payer: 59 | Admitting: Osteopathic Medicine

## 2015-09-28 DIAGNOSIS — Z5329 Procedure and treatment not carried out because of patient's decision for other reasons: Secondary | ICD-10-CM

## 2015-09-28 DIAGNOSIS — Z91199 Patient's noncompliance with other medical treatment and regimen due to unspecified reason: Secondary | ICD-10-CM | POA: Insufficient documentation

## 2015-09-28 NOTE — Progress Notes (Signed)
No show

## 2015-09-29 ENCOUNTER — Telehealth: Payer: Self-pay | Admitting: Osteopathic Medicine

## 2015-09-29 DIAGNOSIS — R7303 Prediabetes: Secondary | ICD-10-CM

## 2015-09-29 DIAGNOSIS — Z13220 Encounter for screening for lipoid disorders: Secondary | ICD-10-CM

## 2015-09-29 DIAGNOSIS — Z0000 Encounter for general adult medical examination without abnormal findings: Secondary | ICD-10-CM

## 2015-09-29 NOTE — Telephone Encounter (Signed)
Olivia Moon called . He would like lab order sent down today for appt scheduled for 7/3. Their insurance is also requesting that they have a nicotine test added to lab order.  Please call Olivia Moon at [phone] when lab Order has been sent down.  Thank you.

## 2015-09-29 NOTE — Telephone Encounter (Signed)
Patient has been informed. Rhonda Cunningham,CMA  

## 2015-09-29 NOTE — Telephone Encounter (Signed)
Orders are in - i ordered them for [facility]... Why the note says "mr" Teti I'm not sure... Anyway can call back and let them know labs are in for [facility]. Can we confirm this is the correct patient? I am not PCP for any female with last name Godeaux.Marland KitchenMarland Kitchen

## 2015-09-29 NOTE — Telephone Encounter (Signed)
Please see note below. Rhonda Cunningham,CMA  

## 2015-10-05 ENCOUNTER — Encounter: Payer: Self-pay | Admitting: Osteopathic Medicine

## 2015-10-05 ENCOUNTER — Ambulatory Visit (INDEPENDENT_AMBULATORY_CARE_PROVIDER_SITE_OTHER): Payer: 59 | Admitting: Osteopathic Medicine

## 2015-10-05 VITALS — BP 127/84 | HR 77 | Ht 63.0 in | Wt 227.0 lb

## 2015-10-05 DIAGNOSIS — G510 Bell's palsy: Secondary | ICD-10-CM

## 2015-10-05 DIAGNOSIS — F3281 Premenstrual dysphoric disorder: Secondary | ICD-10-CM

## 2015-10-05 DIAGNOSIS — Z0000 Encounter for general adult medical examination without abnormal findings: Secondary | ICD-10-CM | POA: Diagnosis not present

## 2015-10-05 DIAGNOSIS — Z309 Encounter for contraceptive management, unspecified: Secondary | ICD-10-CM | POA: Insufficient documentation

## 2015-10-05 DIAGNOSIS — Z13220 Encounter for screening for lipoid disorders: Secondary | ICD-10-CM | POA: Diagnosis not present

## 2015-10-05 DIAGNOSIS — D229 Melanocytic nevi, unspecified: Secondary | ICD-10-CM | POA: Diagnosis not present

## 2015-10-05 DIAGNOSIS — R7303 Prediabetes: Secondary | ICD-10-CM

## 2015-10-05 DIAGNOSIS — Z3009 Encounter for other general counseling and advice on contraception: Secondary | ICD-10-CM

## 2015-10-05 LAB — COMPLETE METABOLIC PANEL WITH GFR
ALBUMIN: 4.3 g/dL (ref 3.6–5.1)
ALK PHOS: 66 U/L (ref 33–115)
ALT: 51 U/L — ABNORMAL HIGH (ref 6–29)
AST: 57 U/L — AB (ref 10–30)
BILIRUBIN TOTAL: 0.6 mg/dL (ref 0.2–1.2)
BUN: 12 mg/dL (ref 7–25)
CO2: 23 mmol/L (ref 20–31)
Calcium: 8.7 mg/dL (ref 8.6–10.2)
Chloride: 107 mmol/L (ref 98–110)
Creat: 0.74 mg/dL (ref 0.50–1.10)
GFR, Est African American: >89 mL/min (ref >=60)
GFR, Est Non African American: >89 mL/min (ref >=60)
GLUCOSE: 109 mg/dL — AB (ref 65–99)
Potassium: 4.4 mmol/L (ref 3.5–5.3)
SODIUM: 139 mmol/L (ref 135–146)
TOTAL PROTEIN: 6.8 g/dL (ref 6.1–8.1)

## 2015-10-05 LAB — HEMOGLOBIN A1C
Hgb A1c MFr Bld: 5.6 % (ref <5.7)
Mean Plasma Glucose: 114 mg/dL

## 2015-10-05 LAB — TSH: TSH: 1.91 mIU/L

## 2015-10-05 LAB — LIPID PANEL
Cholesterol: 195 mg/dL (ref 125–200)
HDL: 32 mg/dL — ABNORMAL LOW (ref >=46)
LDL CALC: 119 mg/dL (ref <130)
Total CHOL/HDL Ratio: 6.1 Ratio — ABNORMAL HIGH (ref <=5.0)
Triglycerides: 219 mg/dL — ABNORMAL HIGH (ref <150)
VLDL: 44 mg/dL — ABNORMAL HIGH (ref <30)

## 2015-10-05 NOTE — Progress Notes (Signed)
HPI: Olivia Moon is a 35 y.o. female who presents to [place] today for chief complaint of:  Chief Complaint  Patient presents with  . Annual Exam    PREVENTIVE CARE - reviewed below. No complaints today. UTD on major screening. FitBit and watching calories - motivated to lose weight.   GYN - Husband has vasectomy. She is having some concerns about mpod changes prior to her menstrual cycle, has been pregnant and breastfeeding past 2+ years.   PREDIABETES - due for A1C check   BELLS PALSY - neuro is referring her to nerve Orthoptist.   MOLES - requests dermatologist referral for skin check, (+)FH multiple types of skin cancer     Past medical, social and family history reviewed: Past Medical History  Diagnosis Date  . Gestational diabetes mellitus, antepartum   . Preeclampsia     "with first pregnancy"  . Gestational diabetes   . Pregnancy induced hypertension   . Bell's palsy 02/24/2015    Recurrent 3, age 69 lasted few weeks, age 79 lasted few months, age 27 onset after stomach viral illness, initial encounter 02/24/2015.   . Bell's palsy    Past Surgical History  Procedure Laterality Date  . None    . No past surgeries    . Cesarean section N/A 08/21/2013    Procedure: CESAREAN SECTION;  Surgeon: Woodroe Mode, MD;  Location: [facility];  Service: Obstetrics;  Laterality: N/A;   Social History  Substance Use Topics  . Smoking status: Never Smoker   . Smokeless tobacco: Never Used  . Alcohol Use: 0.6 oz/week    1 Glasses of wine per week     Comment: occssioanlly   Family History  Problem Relation Age of Onset  . Diabetes Mother   . Hypertension Mother   . Breast cancer Maternal Grandmother   . Hypertension Maternal Grandmother   . Breast cancer Maternal Aunt   . Hypertension Sister   . Diabetes Sister   . Heart disease Father   . Rheumatic fever Father   . Heart disease Paternal Grandfather     Current  Outpatient Prescriptions  Medication Sig Dispense Refill  . artificial tears (LACRILUBE) OINT ophthalmic ointment Place into both eyes every 4 (four) hours as needed for dry eyes. 1 Tube 3  . Hypromellose (ARTIFICIAL TEARS) 0.4 % SOLN Apply 2 drops to eye every 4 (four) hours. 2 Bottle 3  . predniSONE (DELTASONE) 20 MG tablet Take 3 tablets (60 mg total) by mouth daily. 21 tablet 0  . valACYclovir (VALTREX) 1000 MG tablet Take 1 tablet (1,000 mg total) by mouth 3 (three) times daily. 21 tablet 0   No current facility-administered medications for this visit.   Allergies  Allergen Reactions  . Aspirin Anaphylaxis  . Decadron [Dexamethasone] Shortness Of Breath and Itching      Review of Systems: CONSTITUTIONAL:  No  fever, no chills, No recent illness, No unintentional weight changes HEAD/EYES/EARS/NOSE/THROAT: No  headache, no vision change, no hearing change, No sore throat, No  sinus pressure CARDIAC: No  chest pain, No  pressure, No palpitations, No  orthopnea RESPIRATORY: No  cough, No  shortness of breath/wheeze GASTROINTESTINAL: No  nausea, No  vomiting, No  abdominal pain, No  blood in stool, No  diarrhea, No  constipation  MUSCULOSKELETAL: No  myalgia/arthralgia GENITOURINARY: No  incontinence, No  abnormal genital bleeding/discharge SKIN: No  Rash/wounds, (+) concerning lesions - multiple moles and (+)FH skin cancer  HEM/ONC: No  easy bruising/bleeding, No  abnormal lymph node ENDOCRINE: No polyuria/polydipsia/polyphagia, No  heat/cold intolerance  NEUROLOGIC: No  weakness, No  dizziness, No  slurred speech, persistent Bells palsy PSYCHIATRIC: No  concerns with depression, No  concerns with anxiety, No sleep problems  Exam:  BP 127/84 mmHg  Pulse 77  Ht 5\' 3"  (1.6 m)  Wt 227 lb (102.967 kg)  BMI 40.22 kg/m2 Constitutional: VS see above. General Appearance: alert, well-developed, well-nourished, NAD Eyes: Normal lids and conjunctive, non-icteric sclera, PERRLA Ears, Nose,  Mouth, Throat: MMM, Normal external inspection ears/nares/mouth/lips/gums, TM normal bilaterally. Pharynx/tonsils no erythema, no exudate. Nasal mucosa normal.  Neck: No masses, trachea midline. No thyroid enlargement. No tenderness/mass appreciated. No lymphadenopathy Respiratory: Normal respiratory effort. no wheeze, no rhonchi, no rales Cardiovascular: S1/S2 normal, no murmur, no rub/gallop auscultated. RRR. No lower extremity edema. Gastrointestinal: Nontender, no masses. No hepatomegaly, no splenomegaly. No hernia appreciated. Bowel sounds normal. Rectal exam deferred.  Musculoskeletal: Gait normal. No clubbing/cyanosis of digits.  Neurological: Persistent R facial droop d/t bells palsy, otherwise Motor and sensation intact and symmetric Skin: warm, dry, intact. No rash/ulcer. No concerning nevi or subq nodules on limited exam.   Psychiatric: Normal judgment/insight. Normal mood and affect. Oriented x3.      ASSESSMENT/PLAN:  Annual physical exam - Plan: CBC with Differential/Platelet, COMPLETE METABOLIC PANEL WITH GFR, TSH, VITAMIN D 25 Hydroxy (Vit-D Deficiency, Fractures), Nicotine/cotinine metabolites  Lipid screening - Plan: Lipid panel  Bell's palsy  Numerous moles - Plan: Ambulatory referral to Dermatology  Prediabetes - Plan: Hemoglobin A1c  Encounter for other general counseling or advice on contraception - husband has vasectomy  PMDD (premenstrual dysphoric disorder) - consider low dose SSRI vs OCP    FEMALE PREVENTIVE CARE  ANNUAL SCREENING/COUNSELING Tobacco - noNever  Alcohol - social drinker Diet/Exercise - HEALTHY HABITS DISCUSSED TO DECREASE CV RISK - has FitBit Depression - PQH2 Negative Domestic violence concerns - no HTN SCREENING - SEE VITALS Vaccination status - SEE BELOW  SEXUAL HEALTH Sexually active in the past year - yes With - Yes with female. STI - The patient denies history of sexually transmitted disease. STI testing today? -  no   INFECTIOUS DISEASE SCREENING HIV - all adults 15-65 - does not need GC/CT - sexually active - does not need HepC - DOB [birth year] - does not need TB - does not need  DISEASE SCREENING Lipid - needs DM2 - needs Osteoporosis - does not need  CANCER SCREENING Cervical - does not need Breast - does not need Lung - does not need Colon - does not need  ADULT VACCINATION Influenza - already has Td - was not indicated HPV - was not indicated Zoster - was not indicated Pneumonia - was not indicated  OTHER Fall - exercise and Vit D age 41+ - does not need Consider ASA - age 79-59 - does not need    All questions were answered. Visit summary with medication list and pertinent instructions was printed for patient to review. ER/RTC precautions were reviewed with the patient. Return in about 1 year (around 10/04/2016), or sooner if needed, for [employer] .

## 2015-10-05 NOTE — Patient Instructions (Signed)
Last pap 04/11/2014 and normal - next due in 5 years.  Last Tdap vaccine 07/25/14

## 2015-10-06 LAB — CBC WITH DIFFERENTIAL/PLATELET
Basophils Absolute: 0 cells/uL (ref 0–200)
Basophils Relative: 0 %
Eosinophils Absolute: 184 cells/uL (ref 15–500)
Eosinophils Relative: 4 %
HCT: 37.4 % (ref 35.0–45.0)
Hemoglobin: 12.1 g/dL (ref 11.7–15.5)
Lymphocytes Relative: 35 %
Lymphs Abs: 1610 cells/uL (ref 850–3900)
MCH: 29.7 pg (ref 27.0–33.0)
MCHC: 32.4 g/dL (ref 32.0–36.0)
MCV: 91.9 fL (ref 80.0–100.0)
MPV: 10.3 fL (ref 7.5–12.5)
Monocytes Absolute: 322 cells/uL (ref 200–950)
Monocytes Relative: 7 %
Neutro Abs: 2484 cells/uL (ref 1500–7800)
Neutrophils Relative %: 54 %
Platelets: 252 10*3/uL (ref 140–400)
RBC: 4.07 MIL/uL (ref 3.80–5.10)
RDW: 14.3 % (ref 11.0–15.0)
WBC: 4.6 10*3/uL (ref 3.8–10.8)

## 2015-10-06 LAB — NICOTINE/COTININE METABOLITES: Cotinine: <10 ng/mL

## 2015-10-06 LAB — VITAMIN D 25 HYDROXY (VIT D DEFICIENCY, FRACTURES): Vit D, 25-Hydroxy: 28 ng/mL — ABNORMAL LOW (ref 30–100)

## 2015-10-15 IMAGING — US US OB DETAIL+14 WK
1 series · 12 of 28 positions shown · non-contrast
Comparison: none

[Series 1: us ob detail+14 wk · 0.19mm/px · 12 of 84 slices shown]
[im 4/84]
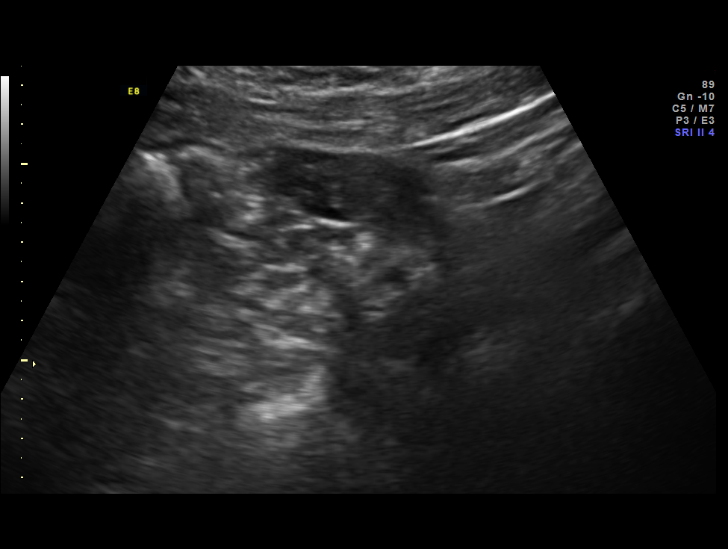
[im 10/84]
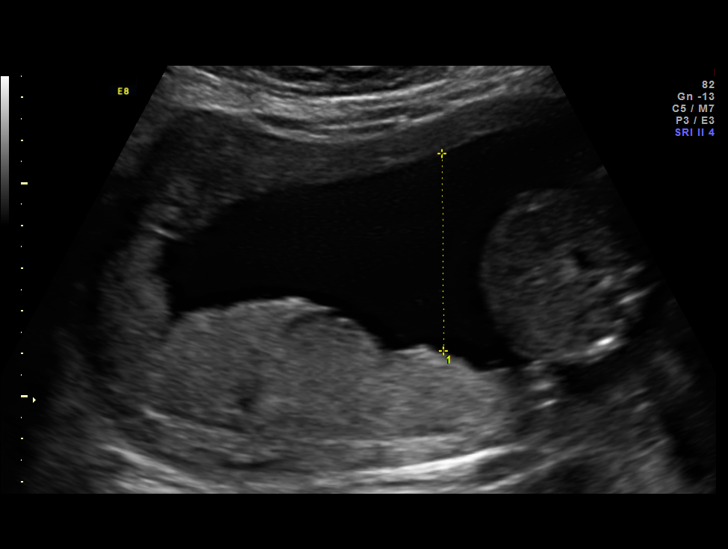
[im 16/84]
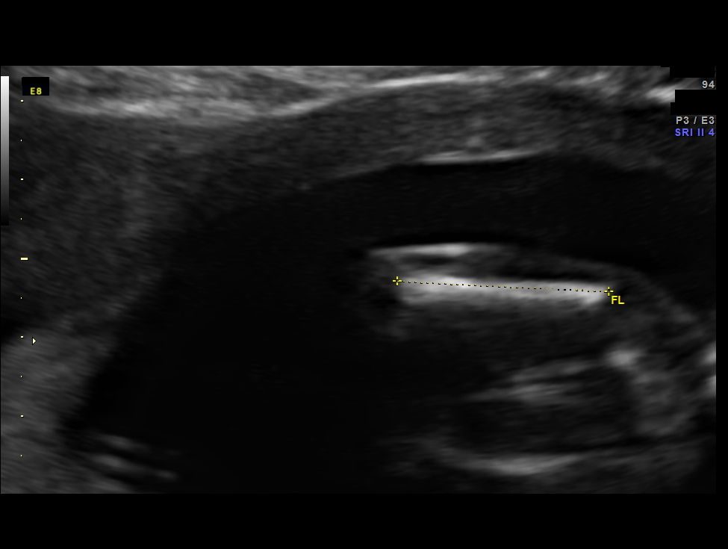
[im 25/84]
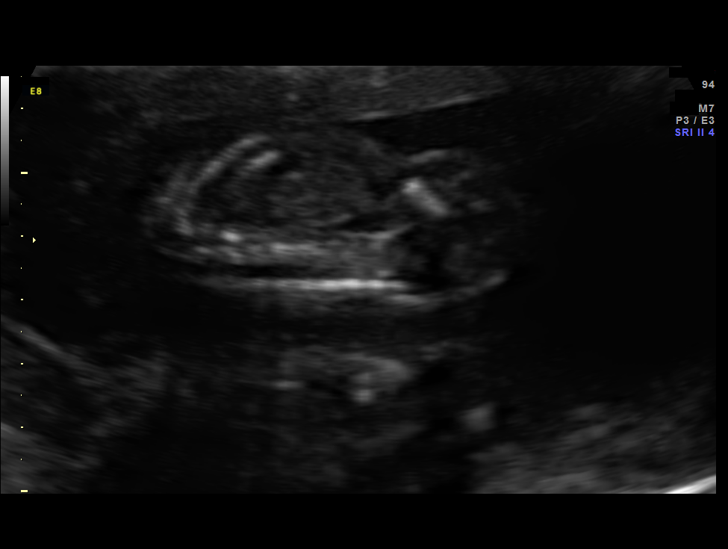
[im 31/84]
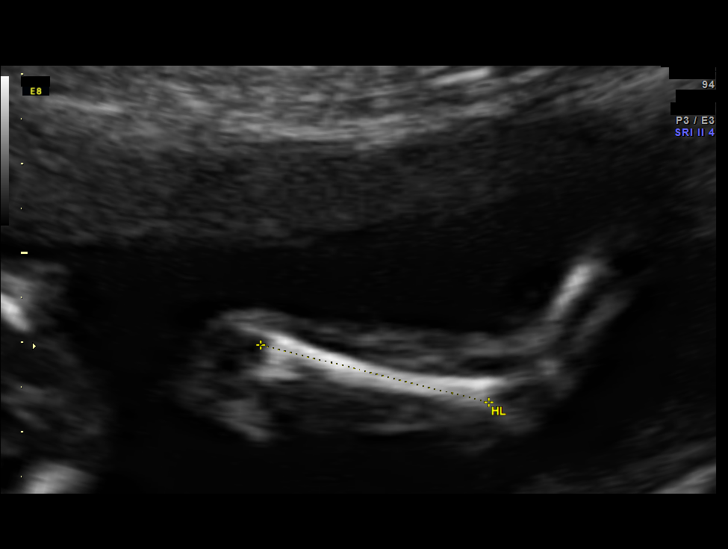
[im 37/84]
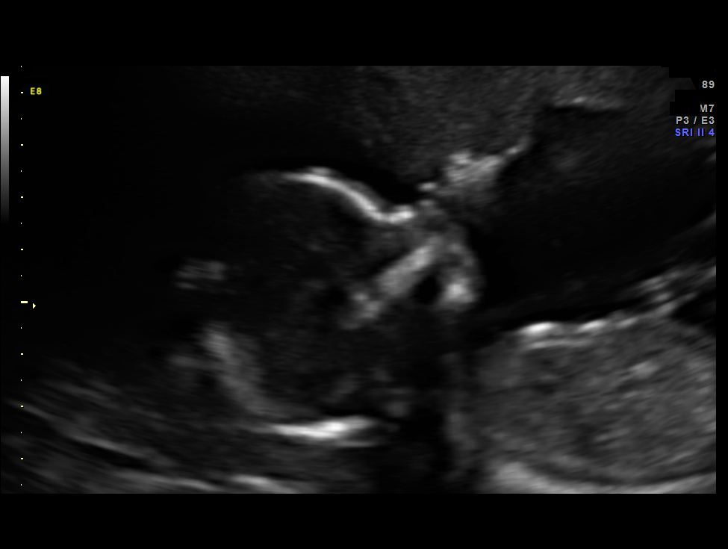
[im 47/84]
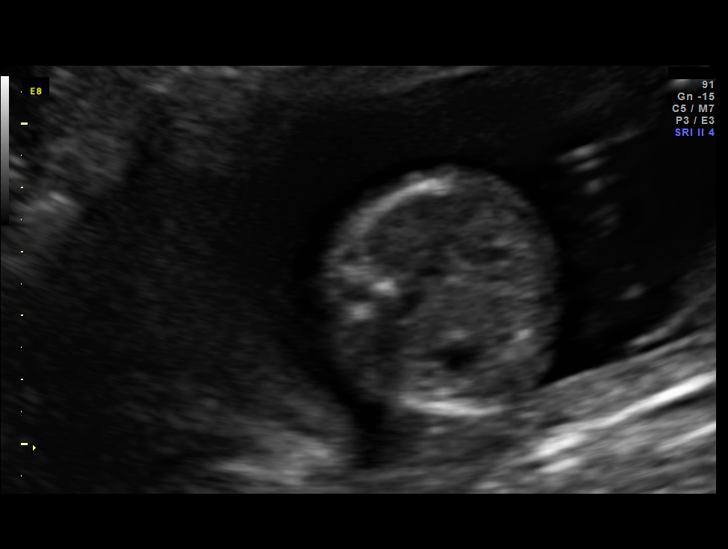
[im 53/84]
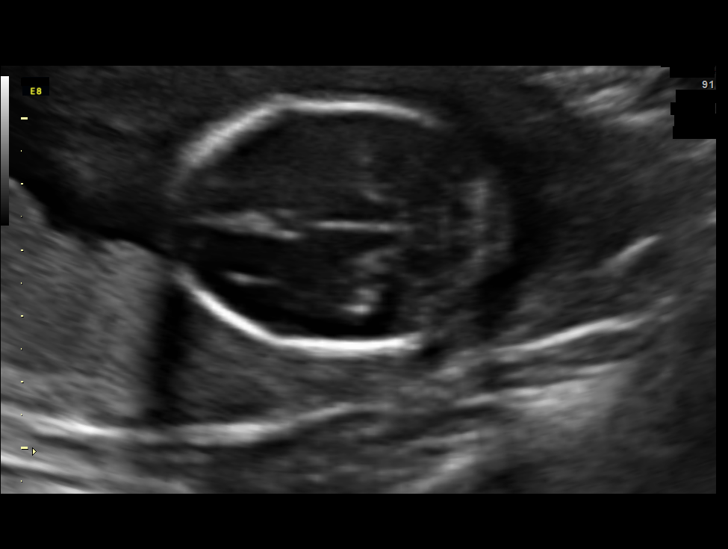
[im 59/84]
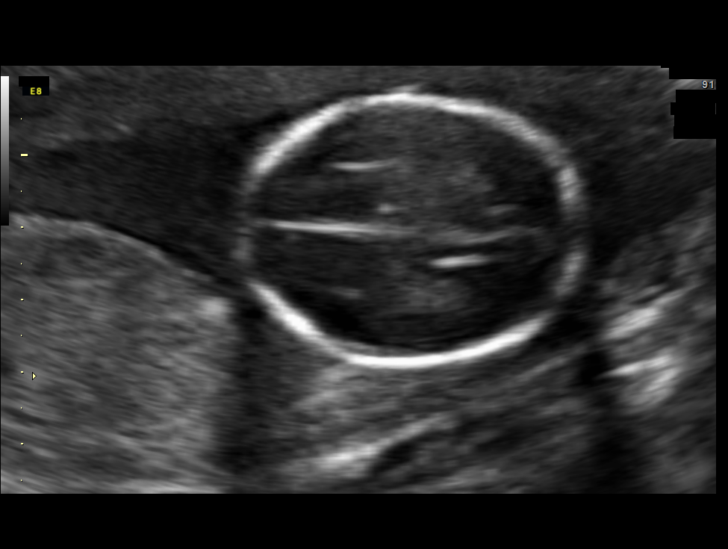
[im 68/84]
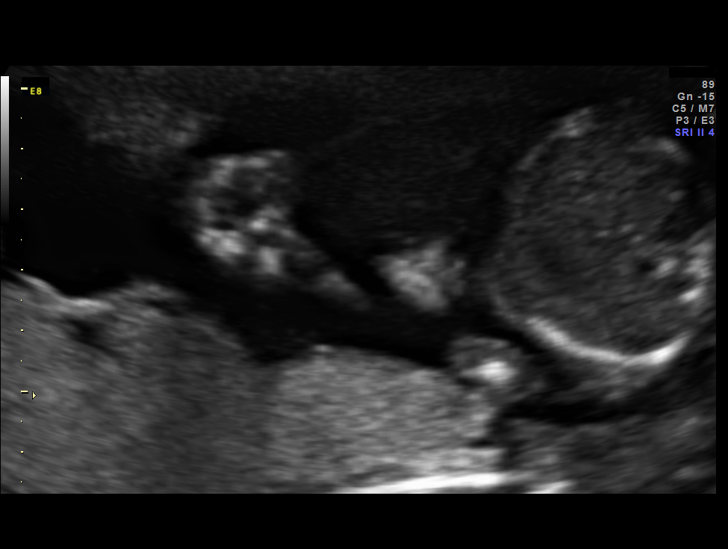
[im 74/84]
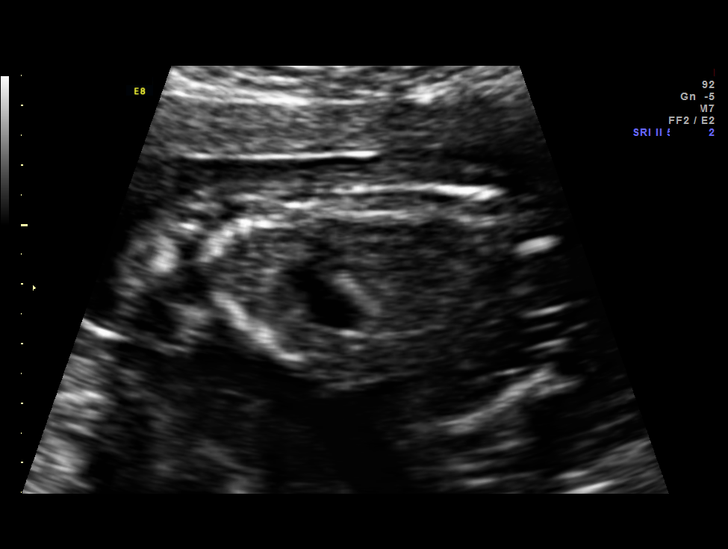
[im 80/84]
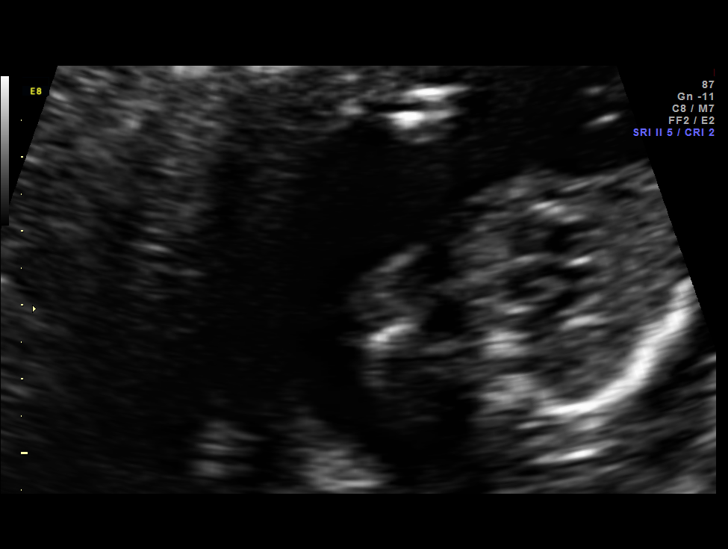

[12 of 28 positions shown; findings below may reference images not displayed]

OBSTETRICS REPORT
                      (Signed Final 05/22/2014 [DATE])

Service(s) Provided

 US OB DETAIL + 14 WK                                  76811.0
Indications

 Detailed fetal anatomic survey                        Z36
 18 weeks gestation of pregnancy
 Fetal abnormality - other known or suspected
 (thick NT)
 Previous cesarean section
 Poor obstetric history: Previous gestational
 diabetes
 Poor obstetric history: Previous preeclampsia /
 eclampsia/gestational HTN
 Poor obstetric history: Previous preterm delivery 33
 weeks
 Advanced maternal age multigravida 34+, second
 trimester- low risk NIPS
Fetal Evaluation

 Num Of Fetuses:    1
 Fetal Heart Rate:  157                          bpm
 Cardiac Activity:  Observed
 Presentation:      Variable
 Placenta:          Posterior, above cervical
                    os
 P. Cord            Visualized
 Insertion:

 Amniotic Fluid
 AFI FV:      Subjectively within normal limits
                                             Larg Pckt:     4.6  cm
Biometry

 BPD:     38.5  mm     G. Age:  17w 5d                CI:         72.4   70 - 86
 OFD:     53.2  mm                                    FL/HC:      18.2   15.8 -
                                                                         18
 HC:     146.8  mm     G. Age:  17w 6d       22  %    HC/AC:      1.17   1.07 -

 AC:     125.7  mm     G. Age:  18w 1d       43  %    FL/BPD:
 FL:      26.7  mm     G. Age:  18w 1d       39  %    FL/AC:      21.2   20 - 24
 HUM:     26.3  mm     G. Age:  18w 2d       55  %
 CER:     18.8  mm     G. Age:  18w 3d       54  %
 NFT:        4  mm
 Est. FW:     224  gm      0 lb 8 oz     43  %
Gestational Age

 U/S Today:     18w 0d                                        EDD:   10/23/14
 Best:          18w 2d     Det. By:  Early Ultrasound         EDD:   10/21/14
                                     (04/11/14)
Anatomy

 Cranium:          Appears normal         Aortic Arch:      Appears normal
 Fetal Cavum:      Appears normal         Ductal Arch:      Not well visualized
 Ventricles:       Appears normal         Diaphragm:        Appears normal
 Choroid Plexus:   Appears normal         Stomach:          Appears normal, left
                                                            sided
 Cerebellum:       Appears normal         Abdomen:          Appears normal
 Posterior Fossa:  Appears normal         Abdominal Wall:   Appears nml (cord
                                                            insert, abd wall)
 Nuchal Fold:      Appears normal         Cord Vessels:     Appears normal (3
                                                            vessel cord)
 Face:             Appears normal         Kidneys:          Appear normal
                   (orbits and profile)
 Lips:             Appears normal         Bladder:          Appears normal
 Heart:            Appears normal         Spine:            Appears normal
                   (4CH, axis, and
                   situs)
 RVOT:             Not well visualized    Lower             Appears normal
                                          Extremities:
 LVOT:             Not well visualized    Upper             Appears normal
                                          Extremities:

 Other:  Fetus appears to be a female. Heels and 5th digit appear normal.
Targeted Anatomy

 Fetal Central Nervous System
 Cisterna Magna:
Cervix Uterus Adnexa

 Cervical Length:    3.9      cm

 Cervix:       Normal appearance by transabdominal scan. Appears
               closed, without funnelling.

 Left Ovary:    Within normal limits.
 Right Ovary:   Not visualized. No adnexal mass visualized.
Impression

 Single IUP at 18w 2d
 Follow up due to thickened nuchal fold - NIPS low risk for
 aneuploidy
 Normal fetal anatomic survey
 Somewhat limited views of the fetal heart were obtained
 (outflow tracts, Ao and ductal arch)
 Posterior placenta without previa
 Normal amniotic fluid volume
Recommendations

 Recommend follow-up ultrasound examination in 4 weeks to
 complete anatomy
 Fetal echo scheduled tomorrow
 questions or concerns.

## 2016-01-27 IMAGING — US US OB FOLLOW-UP
1 series · 12 of 28 positions shown · non-contrast
Comparison: none

[Series 1: us ob follow-up · 0.23mm/px · 12 of 49 slices shown]
[im 2/49]
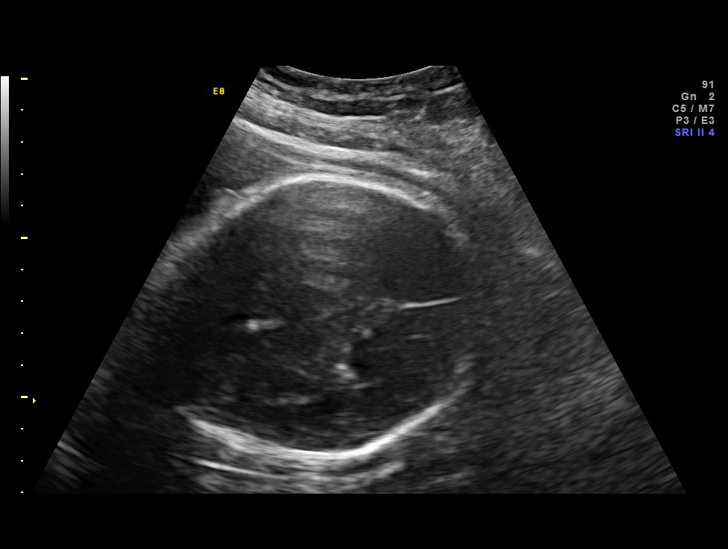
[im 6/49]
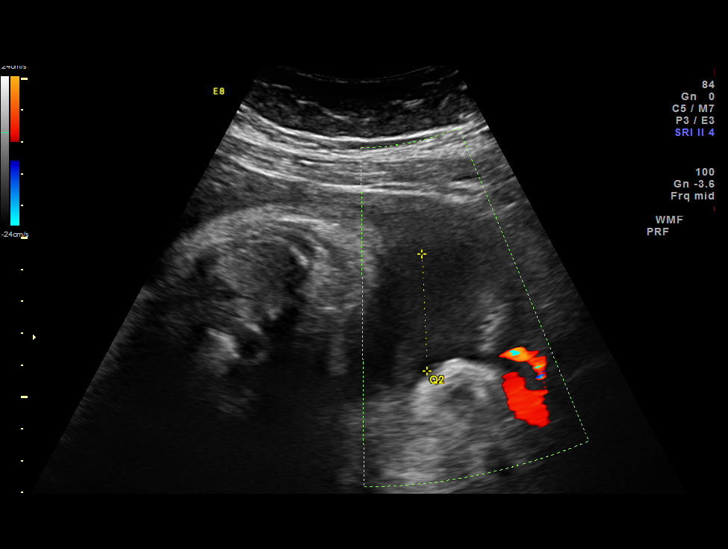
[im 9/49]
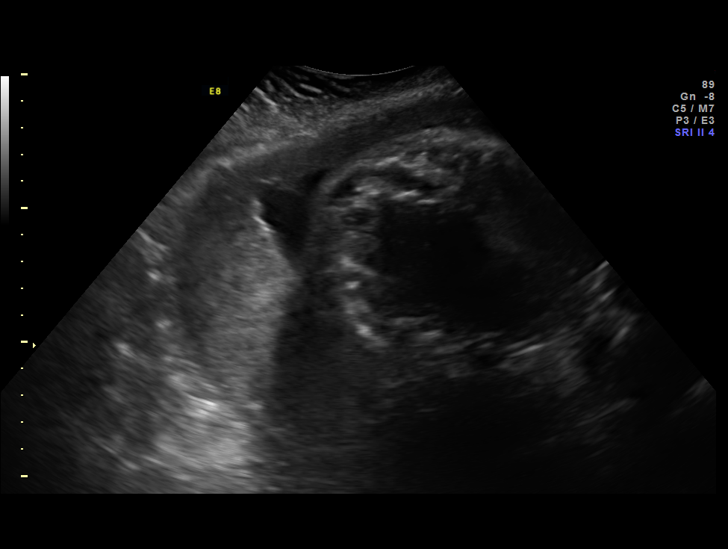
[im 15/49]
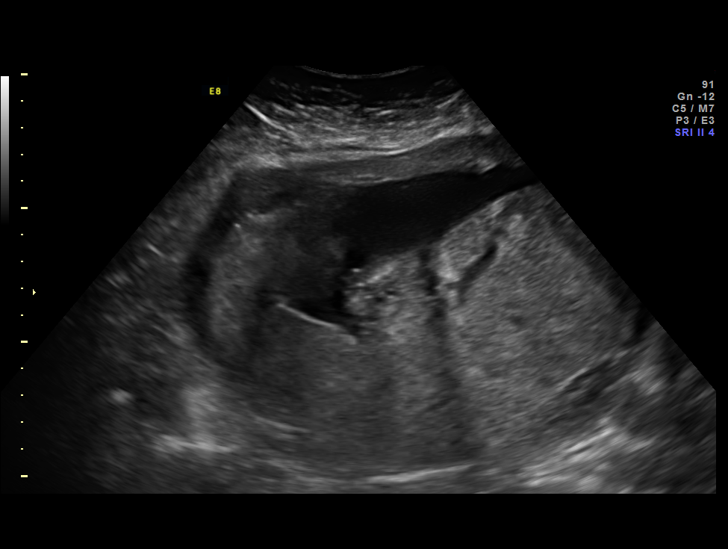
[im 18/49]
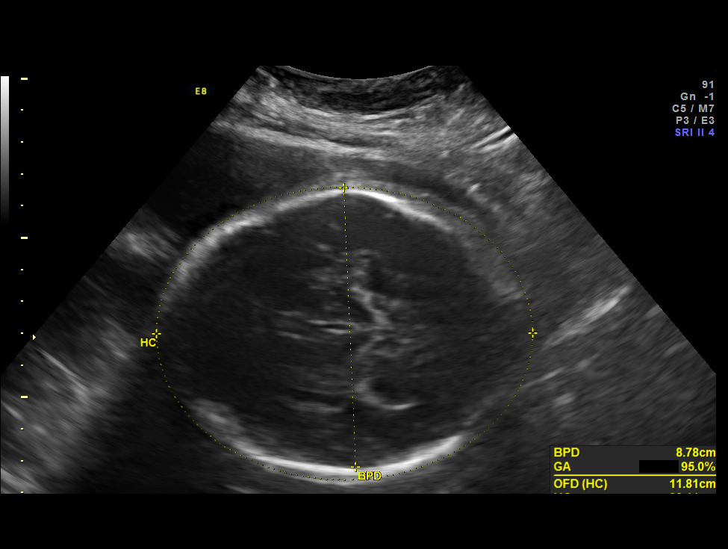
[im 22/49]
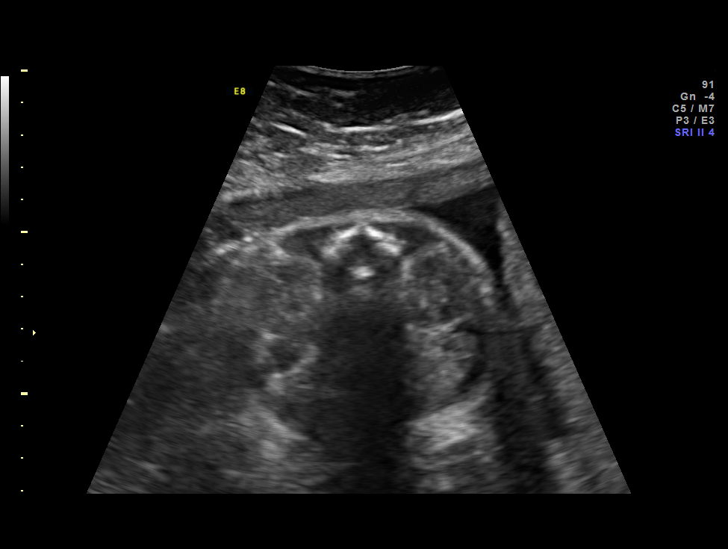
[im 27/49]
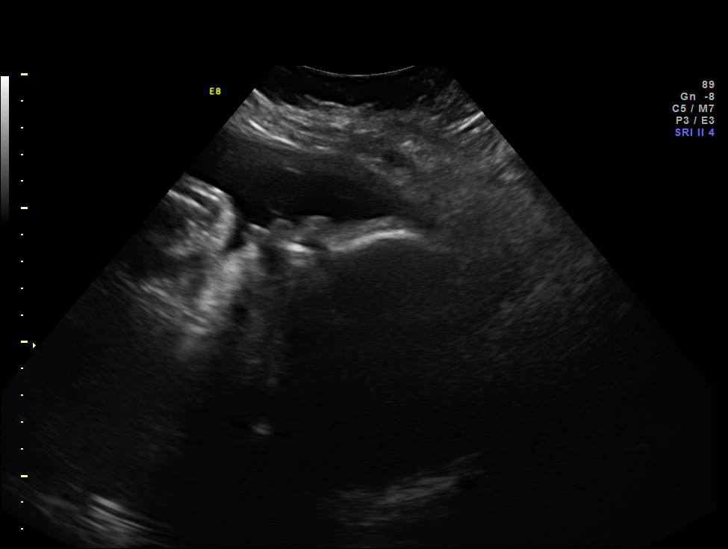
[im 31/49]
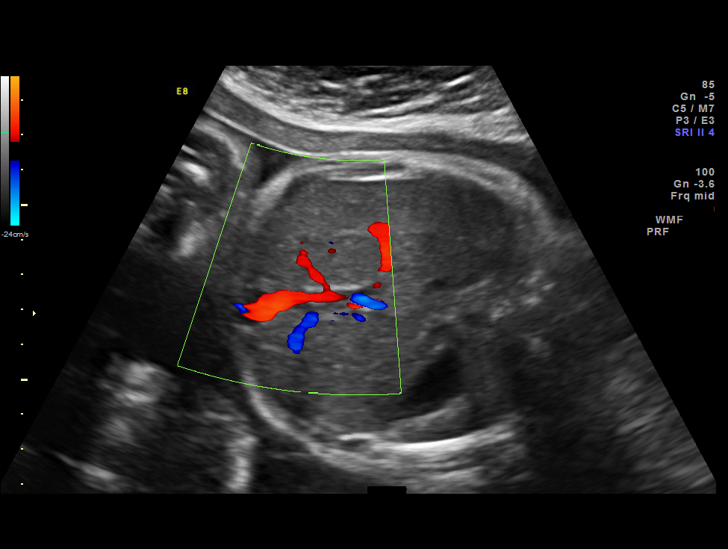
[im 34/49]
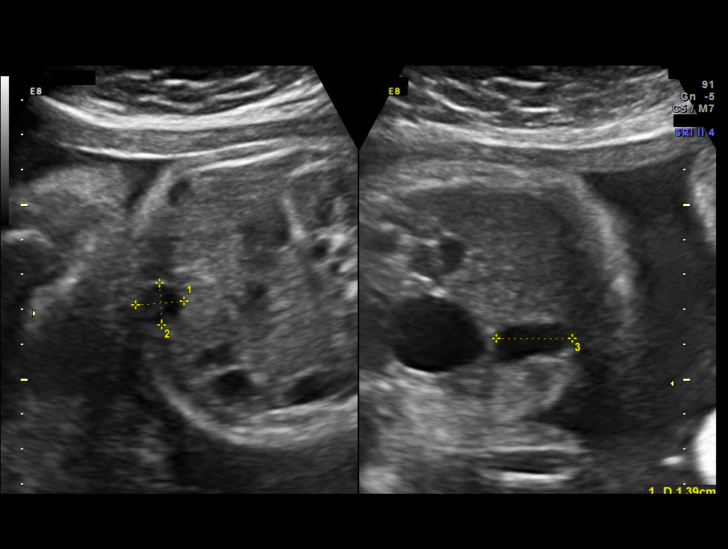
[im 40/49]
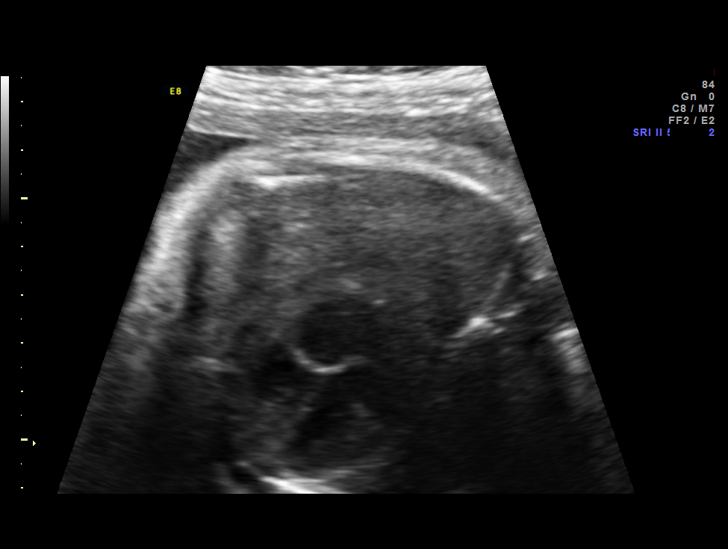
[im 43/49]
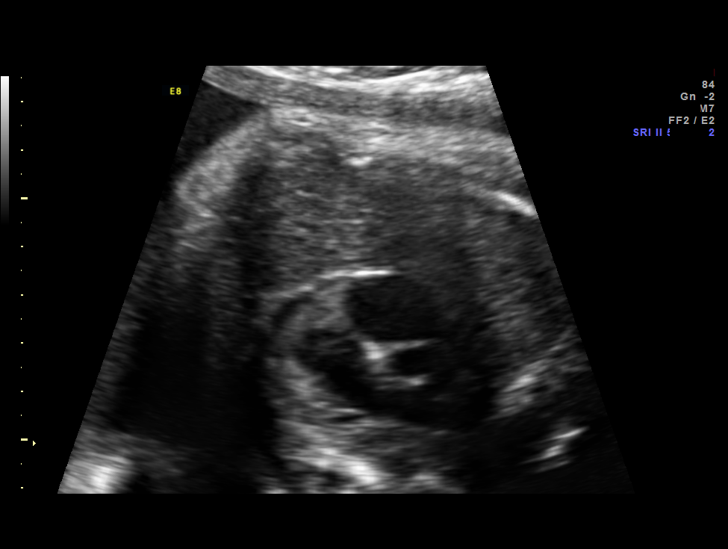
[im 47/49]
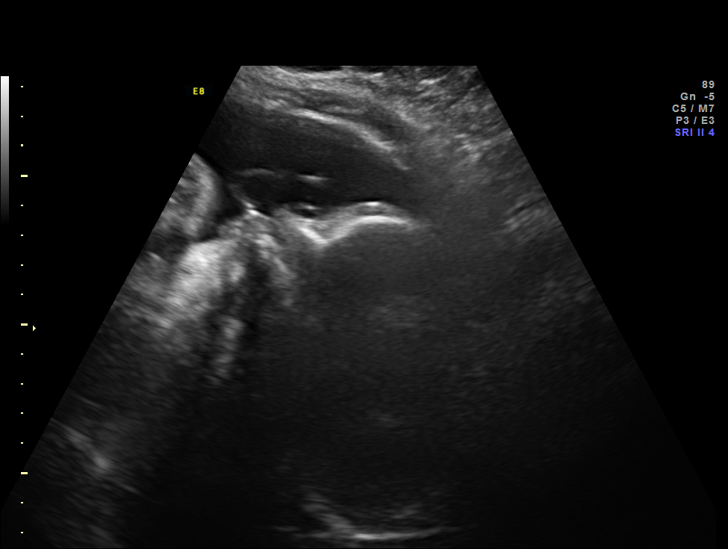

[12 of 28 positions shown; findings below may reference images not displayed]

OBSTETRICS REPORT
(Signed Final 09/03/2014 [DATE])

Service(s) Provided

US OB FOLLOW UP                                       76816.1
Indications

Fetal abnormality - other known or suspected
(thick NT) - normal fetal ECHO
History of genetic / anatomic abnormality - CHD
(pt's sister)
Advanced maternal age multigravida 34, third
trimester (low risk NIPS)
Poor obstetric history: Previous gestational
diabetes
Poor obstetric history: Previous preeclampsia x2
Poor obstetric history: Previous preterm delivery
(33 & 36 weeks)
History of cesarean delivery, currently pregnant
33 weeks gestation of pregnancy
Pre-eclampsia
Fetal Evaluation

Num Of Fetuses:    1
Fetal Heart Rate:  153                          bpm
Cardiac Activity:  Observed
Presentation:      Cephalic
Placenta:          Posterior, above cervical
os
P. Cord            Visualized, central
Insertion:

Amniotic Fluid
AFI FV:      Subjectively within normal limits
AFI Sum:     19.17   cm       71  %Tile     Larg Pckt:    6.86  cm
RUQ:   4.75    cm   RLQ:    3.89   cm    LUQ:   3.67    cm   LLQ:    6.86   cm
Biometry

BPD:     87.5  mm     G. Age:  35w 2d                CI:         75.0   70 - 86
OFD:    116.7  mm                                    FL/HC:      19.6   19.9 -
21.5
HC:     326.8  mm     G. Age:  37w 1d       95  %    HC/AC:      1.07   0.96 -
1.11
AC:     304.4  mm     G. Age:  34w 3d       83  %    FL/BPD:     73.4   71 - 87
FL:      64.2  mm     G. Age:  33w 1d       39  %    FL/AC:      21.1   20 - 24
HUM:     56.9  mm     G. Age:  33w 0d       57  %
Est. FW:    7777  gm      5 lb 6 oz     79  %
Gestational Age

U/S Today:     35w 0d                                        EDD:   10/08/14
Best:          33w 1d     Det. By:  Early Ultrasound         EDD:   10/21/14
(04/11/14)
Anatomy

Cranium:          Appears normal         Aortic Arch:      Previously seen
Fetal Cavum:      Previously seen        Ductal Arch:      Previously seen
Ventricles:       Appears normal         Diaphragm:        Appears normal
Choroid Plexus:   Previously seen        Stomach:          Appears normal, left
sided
Cerebellum:       Previously seen        Abdomen:          Previously seen
Posterior Fossa:  Previously seen        Abdominal Wall:   Previously seen
Nuchal Fold:      Previously seen        Cord Vessels:     Previously seen
Face:             Appears normal         Kidneys:          Appear normal
(orbits and profile)
Lips:             Appears normal         Bladder:          Appears normal
Heart:            Appears normal         Spine:            Previously seen
(4CH, axis, and
situs)
RVOT:             Appears normal         Lower             Previously seen
Extremities:
LVOT:             Appears normal         Upper             Previously seen
Extremities:

Other:  Fetus appears to be a female. Heels and 5th digit previously seen.
Cervix Uterus Adnexa

Cervix:       Not visualized (advanced GA >08wks)
Uterus:       No abnormality visualized.
Cul De Sac:   No free fluid seen.

Left Ovary:    Not visualized.
Right Ovary:   Not visualized.
Adnexa:     No abnormality visualized.
Impression

SIUP at 33+1 weeks
Normal interval anatomy; anatomic survey complete
Normal amniotic fluid volume
Appropriate interval growth with EFW at the 79th %tile
Recommendations

Growth in 4 weeks (doubt she will make it with recent
diagnosis of preeclampsia)
Continue twice weekly NSTs with weekly AFIs

questions or concerns.

## 2016-04-19 DIAGNOSIS — Z0289 Encounter for other administrative examinations: Secondary | ICD-10-CM

## 2017-02-15 ENCOUNTER — Encounter: Payer: Self-pay | Admitting: *Deleted

## 2017-02-15 ENCOUNTER — Ambulatory Visit: Payer: 59 | Admitting: Obstetrics & Gynecology

## 2017-02-15 ENCOUNTER — Encounter: Payer: Self-pay | Admitting: Obstetrics & Gynecology

## 2017-02-15 VITALS — BP 116/78 | HR 89 | Resp 16 | Ht 63.0 in | Wt 222.0 lb

## 2017-02-15 DIAGNOSIS — Z3202 Encounter for pregnancy test, result negative: Secondary | ICD-10-CM | POA: Diagnosis not present

## 2017-02-15 DIAGNOSIS — N926 Irregular menstruation, unspecified: Secondary | ICD-10-CM

## 2017-02-15 NOTE — Progress Notes (Signed)
Nursed until June to November 2016 and then some pumping Resumed menses in January 2017; once a month, cycle 5 days, started to get heavy days 2-4. Two months ago, nausea and dysmenorrha around menses.   September day heavy cycle; stopped and bled again; stopped Bleeds 5 days, stops 8 days, for the past 2 months.

## 2017-02-15 NOTE — Progress Notes (Signed)
   Subjective:    Patient ID: Olivia Moon, female    DOB: 1980/08/25, 36 y.o.   MRN: [id number]  HPI Pt c/o new onset change in her menstrual cycle.  Pt had VBAC in 2016.  Nursed until June to November 2016 and then some pumping.  Resumed menses in January 2017; cycle was once a month, cycle 5 days, started to get heavy days 2-4 of cycle but no bothersome.   Two months ago, nausea and dysmenorrha around menses.  Pt also has been having cycles of bleeding 5 days, stops 8 days, bleeding returns.  Thas had been occurring for the past 2 months. Pt also has new onset vertigo and dizziness with change in position.     Review of Systems  Constitutional: Negative.   Respiratory: Negative.   Cardiovascular: Negative.   Gastrointestinal: Negative.   Genitourinary: Positive for menstrual problem, pelvic pain and vaginal bleeding.  Psychiatric/Behavioral: Negative.        Objective:   Physical Exam  Constitutional: She is oriented to person, place, and time. She appears well-developed and well-nourished. No distress.  HENT:  Head: Normocephalic and atraumatic.  Eyes: Conjunctivae are normal.  Neck: Thyromegaly present.  Pulmonary/Chest: Effort normal.  Abdominal: Soft. Bowel sounds are normal. She exhibits no distension. There is no tenderness.  Genitourinary:  Genitourinary Comments: Cervix closed Uterus enlarged on bimanual 14 weeks size No adnexal masses or tenderness but limited by habitus.  Musculoskeletal: She exhibits no edema.  Neurological: She is alert and oriented to person, place, and time.  Skin: Skin is warm and dry.  Psychiatric: She has a normal mood and affect.  Vitals reviewed.  Vitals:   02/15/17 0919  BP: 116/78  Pulse: 89  Resp: 16  Weight: 222 lb (100.7 kg)  Height: 5\' 3"  (1.6 m)   Assessment & Plan:  35 yo female with menometrorrhagia  1.Pelvic US and TVUS 2.  TSH, Hgb A1C, Vit D; health maintenance labs--CMP, CBC, Lipids 3.  Endometrial biopsy. 4.   RTC 2 weeks.  ENDOMETRIAL BIOPSY     The indications for endometrial biopsy were reviewed.   Risks of the biopsy including cramping, bleeding, infection, uterine perforation, inadequate specimen and need for additional procedures  were discussed. The patient states she understands and agrees to undergo procedure today. Consent was signed. Time out was performed. Urine HCG was negative. A sterile speculum was placed in the patient's vagina and the cervix was prepped with Betadine. A single-toothed tenaculum was placed on the anterior lip of the cervix to stabilize it. The 3 mm pipelle was introduced into the endometrial cavity without difficulty to a depth of 10 cm, and a moderate amount of tissue was obtained and sent to pathology. The instruments were removed from the patient's vagina. Minimal bleeding from the cervix was noted. The patient tolerated the procedure well. Routine post-procedure instructions were given to the patient. The patient will follow up to review the results and for further management.

## 2017-02-16 ENCOUNTER — Other Ambulatory Visit: Payer: Self-pay | Admitting: Obstetrics & Gynecology

## 2017-02-16 ENCOUNTER — Telehealth: Payer: Self-pay | Admitting: *Deleted

## 2017-02-16 LAB — COMPREHENSIVE METABOLIC PANEL
AG RATIO: 1.4 (calc) (ref 1.0–2.5)
ALBUMIN MSPROF: 4.3 g/dL (ref 3.6–5.1)
ALT: 26 U/L (ref 6–29)
AST: 19 U/L (ref 10–30)
Alkaline phosphatase (APISO): 58 U/L (ref 33–115)
BUN: 10 mg/dL (ref 7–25)
CHLORIDE: 104 mmol/L (ref 98–110)
CO2: 25 mmol/L (ref 20–32)
CREATININE: 0.71 mg/dL (ref 0.50–1.10)
Calcium: 9.3 mg/dL (ref 8.6–10.2)
GLOBULIN: 3.1 g/dL (ref 1.9–3.7)
GLUCOSE: 87 mg/dL (ref 65–99)
Potassium: 4.7 mmol/L (ref 3.5–5.3)
SODIUM: 138 mmol/L (ref 135–146)
TOTAL PROTEIN: 7.4 g/dL (ref 6.1–8.1)
Total Bilirubin: 0.5 mg/dL (ref 0.2–1.2)

## 2017-02-16 LAB — LIPID PANEL
CHOL/HDL RATIO: 5.6 (calc) — AB (ref <5.0)
CHOLESTEROL: 225 mg/dL — AB (ref <200)
HDL: 40 mg/dL — ABNORMAL LOW (ref >50)
LDL Cholesterol (Calc): 146 mg/dL (calc) — ABNORMAL HIGH
Non-HDL Cholesterol (Calc): 185 mg/dL (calc) — ABNORMAL HIGH (ref <130)
Triglycerides: 244 mg/dL — ABNORMAL HIGH (ref <150)

## 2017-02-16 LAB — HEMOGLOBIN A1C
HEMOGLOBIN A1C: 5.3 %{Hb} (ref <5.7)
Mean Plasma Glucose: 105 (calc)
eAG (mmol/L): 5.8 (calc)

## 2017-02-16 LAB — CBC
HCT: 35.1 % (ref 35.0–45.0)
HEMOGLOBIN: 11.9 g/dL (ref 11.7–15.5)
MCH: 30 pg (ref 27.0–33.0)
MCHC: 33.9 g/dL (ref 32.0–36.0)
MCV: 88.4 fL (ref 80.0–100.0)
MPV: 10.9 fL (ref 7.5–12.5)
PLATELETS: 284 10*3/uL (ref 140–400)
RBC: 3.97 10*6/uL (ref 3.80–5.10)
RDW: 13 % (ref 11.0–15.0)
WBC: 8.1 10*3/uL (ref 3.8–10.8)

## 2017-02-16 LAB — INSULIN, RANDOM: Insulin: 9.6 u[IU]/mL (ref 2.0–19.6)

## 2017-02-16 LAB — VITAMIN D 25 HYDROXY (VIT D DEFICIENCY, FRACTURES): Vit D, 25-Hydroxy: 19 ng/mL — ABNORMAL LOW (ref 30–100)

## 2017-02-16 LAB — TSH: TSH: 1.85 m[IU]/L

## 2017-02-16 MED ORDER — VITAMIN D (ERGOCALCIFEROL) 1.25 MG (50000 UNIT) PO CAPS: 50000.0000 [IU] | ORAL_CAPSULE | ORAL | 1 refills | Status: AC

## 2017-02-16 NOTE — Telephone Encounter (Signed)
-----   Message from Guss Bunde, MD sent at 02/16/2017 11:37 AM EST ----- Pt has hypercholesterolemia and hypertriglyceridemia and needs referral to PCP for management.  Patient has low vitamin D and need 50K units weekly for 6 weeks then rpt level.

## 2017-02-16 NOTE — Telephone Encounter (Signed)
LM for pt to call regarding her labwork and a new RX.

## 2017-02-20 LAB — POCT URINE PREGNANCY: PREG TEST UR: NEGATIVE

## 2017-02-27 ENCOUNTER — Ambulatory Visit (HOSPITAL_COMMUNITY)
Admission: RE | Admit: 2017-02-27 | Discharge: 2017-02-27 | Disposition: A | Discharge status: Home / Self Care | Payer: 59 | Source: Ambulatory Visit | Attending: Obstetrics & Gynecology | Admitting: Obstetrics & Gynecology

## 2017-02-27 DIAGNOSIS — N926 Irregular menstruation, unspecified: Secondary | ICD-10-CM

## 2017-02-28 ENCOUNTER — Telehealth: Payer: Self-pay | Admitting: *Deleted

## 2017-02-28 ENCOUNTER — Ambulatory Visit: Payer: 59 | Admitting: Obstetrics & Gynecology

## 2017-02-28 NOTE — Telephone Encounter (Signed)
Pt notified and appt made to discuss with Dr Gala Romney

## 2017-02-28 NOTE — Telephone Encounter (Signed)
-----   Message from Guss Bunde, MD sent at 02/27/2017  5:07 PM EST ----- Normal [place].  Pt has f/u appt and will discuss at visit

## 2017-03-02 ENCOUNTER — Ambulatory Visit: Payer: 59 | Admitting: Obstetrics and Gynecology

## 2017-03-06 ENCOUNTER — Ambulatory Visit: Payer: 59 | Admitting: Obstetrics & Gynecology

## 2017-03-17 ENCOUNTER — Ambulatory Visit: Payer: 59 | Admitting: Obstetrics & Gynecology

## 2017-08-07 ENCOUNTER — Encounter: Payer: Self-pay | Admitting: Osteopathic Medicine

## 2018-07-23 IMAGING — US US PELVIS COMPLETE
2 series · 15 of 25 positions shown · non-contrast
Comparison: 09/03/2014

CLINICAL DATA: Nausea, dysmenorrhea

EXAM:
TRANSABDOMINAL AND TRANSVAGINAL ULTRASOUND OF PELVIS
TECHNIQUE: Both transabdominal and transvaginal ultrasound examinations of the
pelvis were performed. Transabdominal technique was performed for
global imaging of the pelvis including uterus, ovaries, adnexal
regions, and pelvic cul-de-sac. It was necessary to proceed with
endovaginal exam following the transabdominal exam to visualize the
uterus, endometrium, ovaries and adnexa .

[Series 1: us pelvis complete · 14 of 67 slices shown (1 of 2)]
[im 1/67]
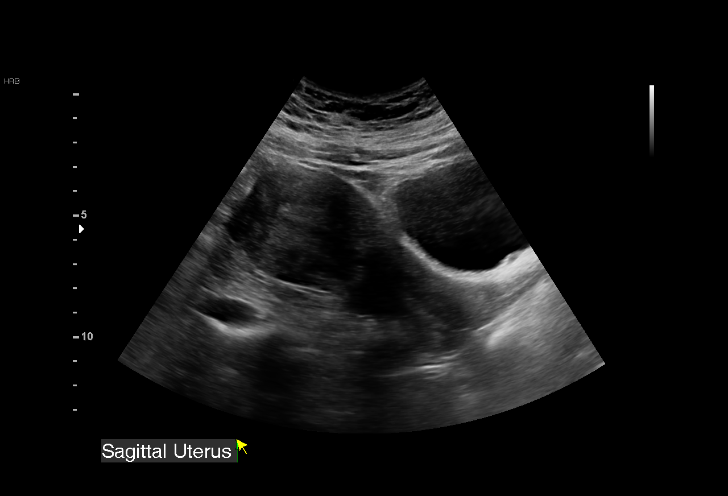
[im 6/67]
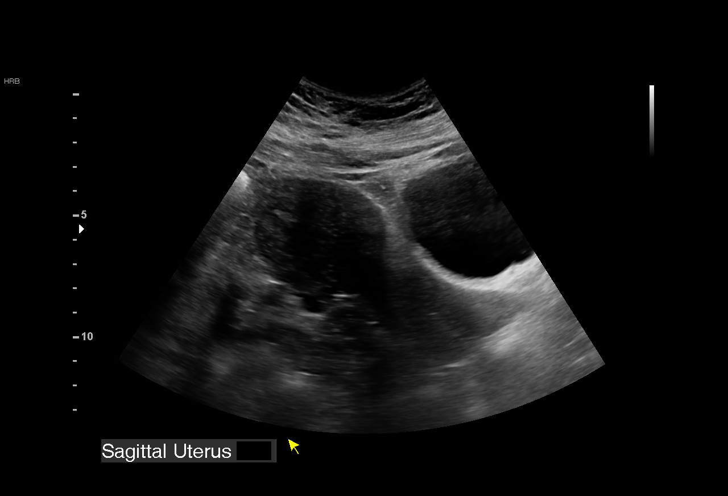
[im 12/67]
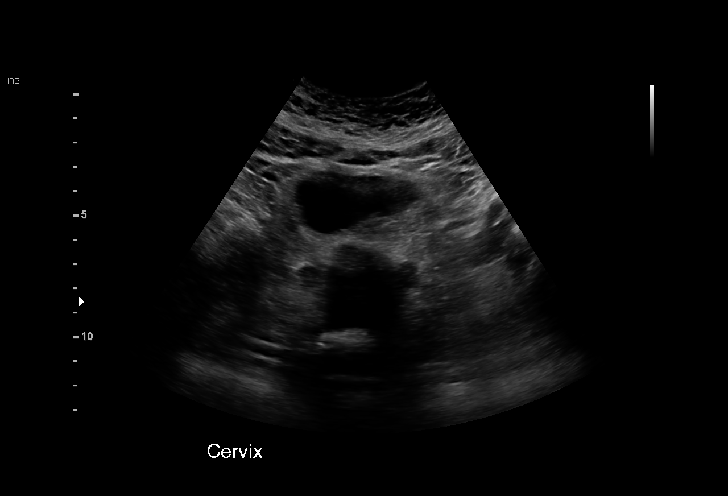
[im 15/67]
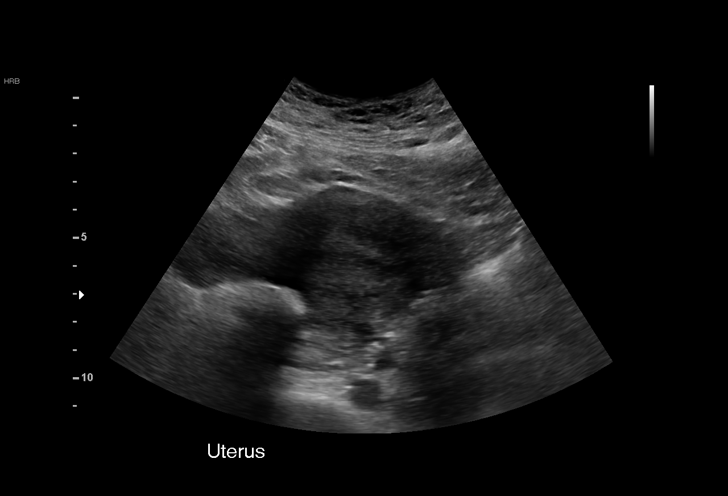
[im 21/67]
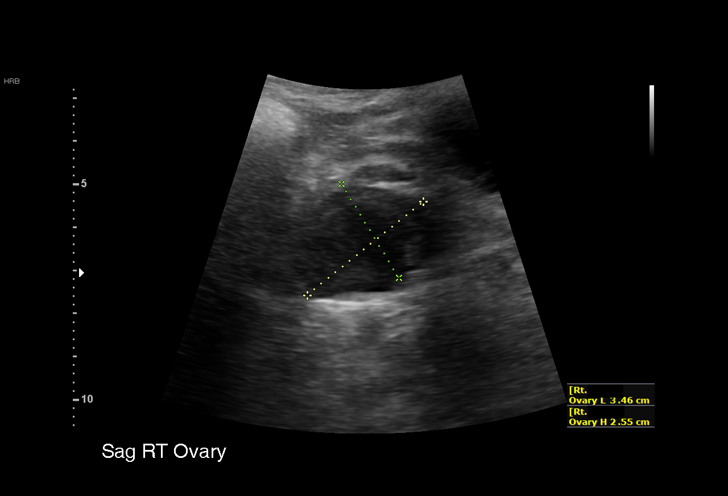
[im 26/67]
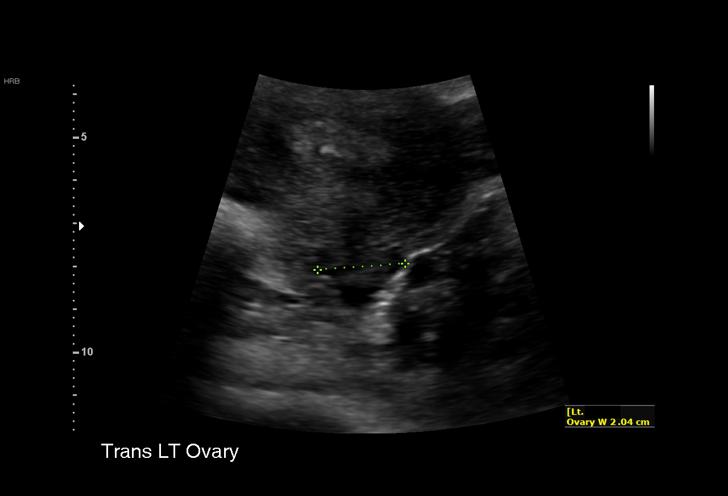
[im 29/67]
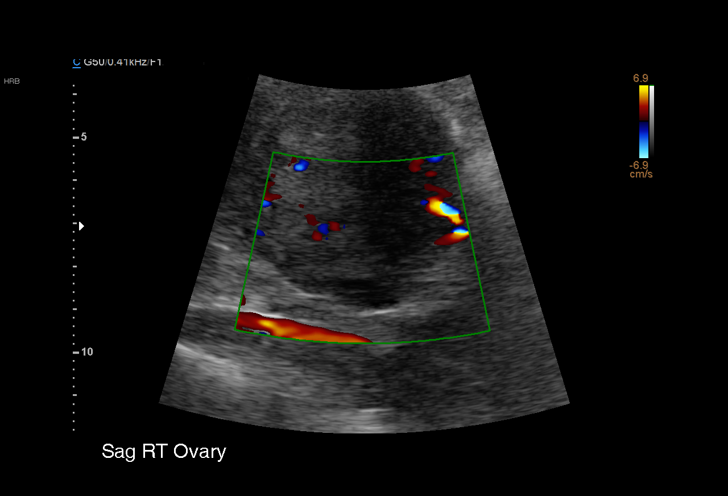
[im 35/67]
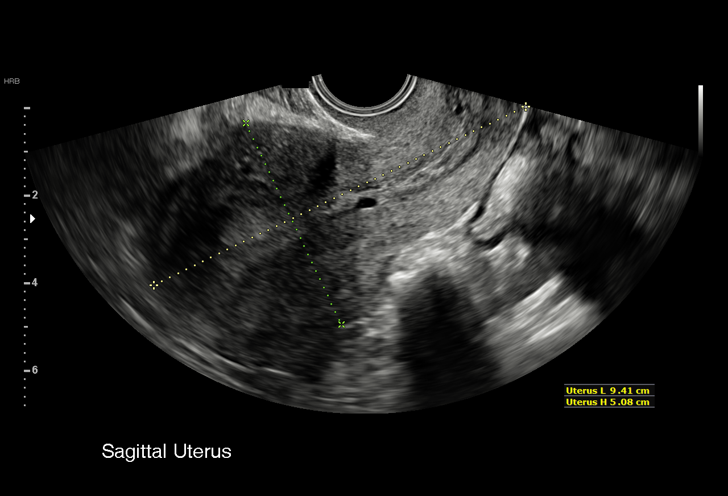
[im 41/67]
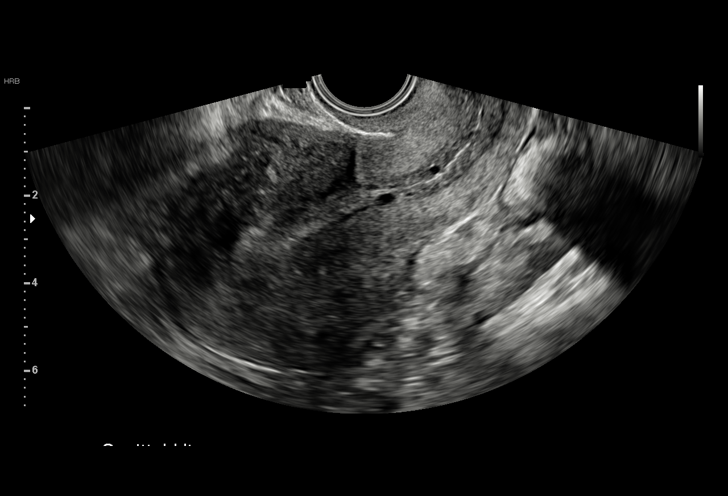
[im 44/67]
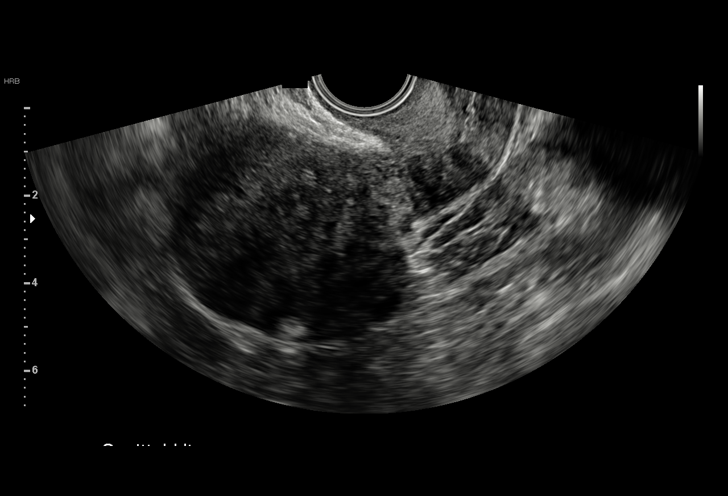
[im 49/67]
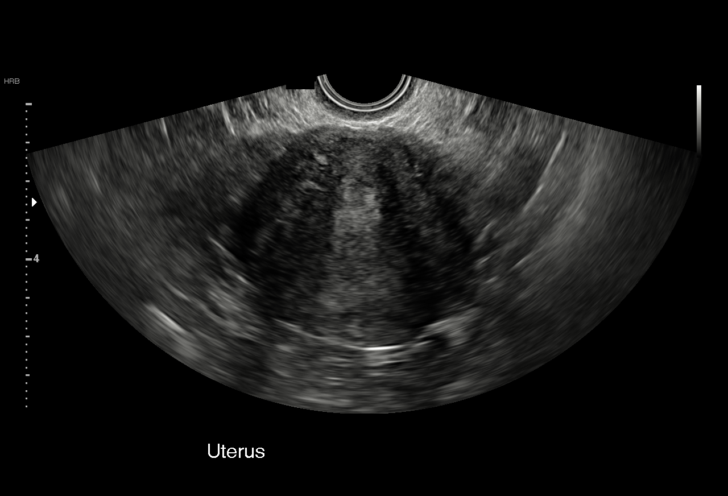
[im 55/67]
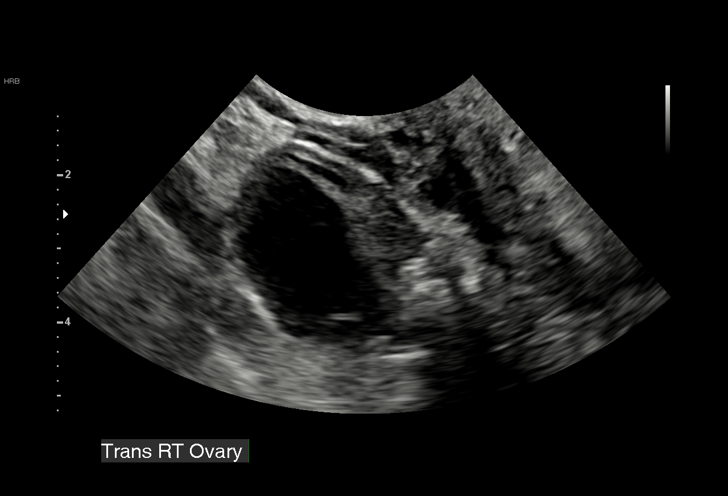
[im 58/67]
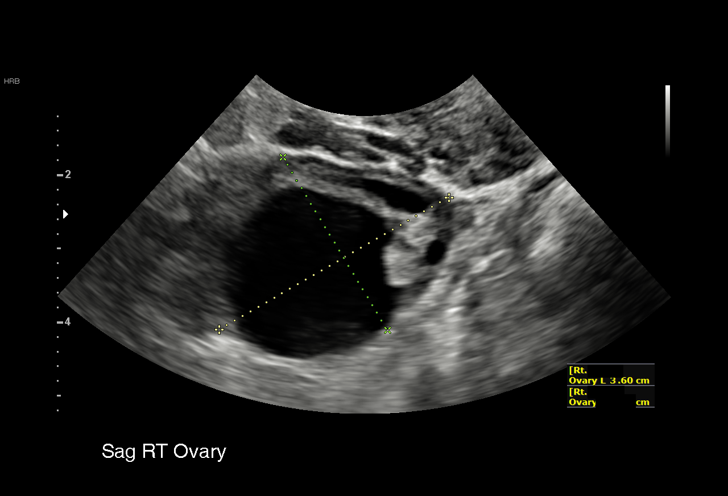
[im 64/67]
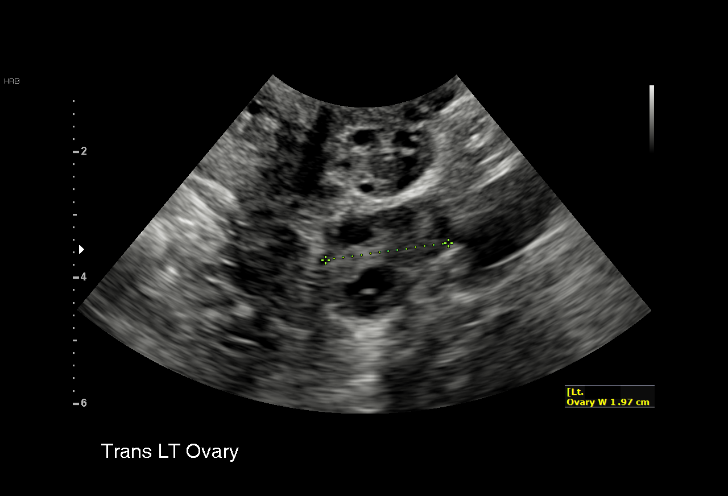

[Series 3: us pelvis complete · 1 of 2 slices shown (2 of 2)]
[im 1/2]
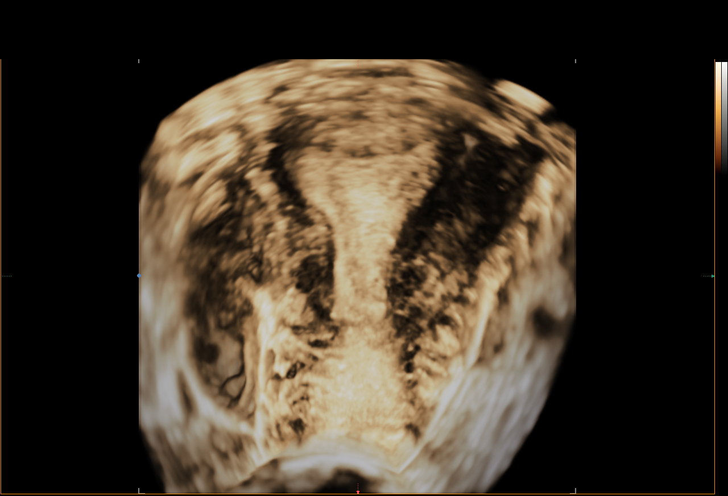

[15 of 25 positions shown; findings below may reference images not displayed]

FINDINGS: Uterus

Measurements: 10.2 x 5.1 x 6.2 cm. No fibroids or other mass
visualized.

Endometrium

Thickness: 10 mm in thickness.  No focal abnormality visualized.

Right ovary

Measurements: 3.5 x 2.7 x 2.8 cm. 2.7 cm simple appearing cyst or
dominant follicle. No adnexal mass.

Left ovary

Measurements: 2.9 x 1.3 x 2.0 cm. Normal appearance/no adnexal mass.

Other findings

No abnormal free fluid.
IMPRESSION: Unremarkable pelvic ultrasound.
